# Patient Record
Sex: Male | Born: 1955 | Race: Black or African American | Hispanic: No | Marital: Married | Smoking: Former smoker
Health system: Southern US, Community
[De-identification: ages and names within clinical notes are randomized; demographics above are authoritative.]

## PROBLEM LIST (undated history)

## (undated) DIAGNOSIS — I10 Essential (primary) hypertension: Secondary | ICD-10-CM

## (undated) DIAGNOSIS — E119 Type 2 diabetes mellitus without complications: Secondary | ICD-10-CM

## (undated) DIAGNOSIS — E785 Hyperlipidemia, unspecified: Secondary | ICD-10-CM

## (undated) DIAGNOSIS — J449 Chronic obstructive pulmonary disease, unspecified: Secondary | ICD-10-CM

## (undated) DIAGNOSIS — I639 Cerebral infarction, unspecified: Secondary | ICD-10-CM

## (undated) DIAGNOSIS — C3490 Malignant neoplasm of unspecified part of unspecified bronchus or lung: Secondary | ICD-10-CM

## (undated) DIAGNOSIS — M1990 Unspecified osteoarthritis, unspecified site: Secondary | ICD-10-CM

## (undated) DIAGNOSIS — T7840XA Allergy, unspecified, initial encounter: Secondary | ICD-10-CM

## (undated) DIAGNOSIS — Z66 Do not resuscitate: Secondary | ICD-10-CM

## (undated) HISTORY — DX: Cerebral infarction, unspecified: I63.9

## (undated) HISTORY — PX: OTHER SURGICAL HISTORY: SHX169

## (undated) HISTORY — DX: Essential (primary) hypertension: I10

## (undated) HISTORY — DX: Hyperlipidemia, unspecified: E78.5

## (undated) HISTORY — DX: Chronic obstructive pulmonary disease, unspecified: J44.9

## (undated) HISTORY — DX: Do not resuscitate: Z66

## (undated) HISTORY — DX: Unspecified osteoarthritis, unspecified site: M19.90

## (undated) HISTORY — DX: Type 2 diabetes mellitus without complications: E11.9

## (undated) HISTORY — DX: Malignant neoplasm of unspecified part of unspecified bronchus or lung: C34.90

## (undated) HISTORY — DX: Allergy, unspecified, initial encounter: T78.40XA

---

## 2014-07-31 ENCOUNTER — Encounter (HOSPITAL_COMMUNITY): Payer: Medicare Other | Attending: Hematology & Oncology | Admitting: Hematology & Oncology

## 2014-07-31 ENCOUNTER — Encounter (HOSPITAL_COMMUNITY): Payer: Self-pay | Admitting: Hematology & Oncology

## 2014-07-31 VITALS — BP 105/69 | HR 113 | Temp 98.0°F | Resp 22 | Wt 163.4 lb

## 2014-07-31 DIAGNOSIS — J90 Pleural effusion, not elsewhere classified: Secondary | ICD-10-CM

## 2014-07-31 DIAGNOSIS — R918 Other nonspecific abnormal finding of lung field: Secondary | ICD-10-CM | POA: Diagnosis not present

## 2014-07-31 DIAGNOSIS — C3490 Malignant neoplasm of unspecified part of unspecified bronchus or lung: Secondary | ICD-10-CM | POA: Insufficient documentation

## 2014-07-31 DIAGNOSIS — M25511 Pain in right shoulder: Secondary | ICD-10-CM

## 2014-07-31 DIAGNOSIS — M549 Dorsalgia, unspecified: Secondary | ICD-10-CM

## 2014-07-31 DIAGNOSIS — D649 Anemia, unspecified: Secondary | ICD-10-CM

## 2014-07-31 DIAGNOSIS — Z87891 Personal history of nicotine dependence: Secondary | ICD-10-CM

## 2014-07-31 LAB — CBC WITH DIFFERENTIAL/PLATELET
BASOS ABS: 0 10*3/uL (ref 0.0–0.1)
BASOS PCT: 0 % (ref 0–1)
Eosinophils Absolute: 0.1 10*3/uL (ref 0.0–0.7)
Eosinophils Relative: 1 % (ref 0–5)
HCT: 26.9 % — ABNORMAL LOW (ref 39.0–52.0)
Hemoglobin: 7.7 g/dL — ABNORMAL LOW (ref 13.0–17.0)
LYMPHS PCT: 17 % (ref 12–46)
Lymphs Abs: 2.3 10*3/uL (ref 0.7–4.0)
MCH: 19.1 pg — ABNORMAL LOW (ref 26.0–34.0)
MCHC: 28.6 g/dL — ABNORMAL LOW (ref 30.0–36.0)
MCV: 66.7 fL — ABNORMAL LOW (ref 78.0–100.0)
Monocytes Absolute: 1.5 10*3/uL — ABNORMAL HIGH (ref 0.1–1.0)
Monocytes Relative: 12 % (ref 3–12)
Neutro Abs: 9.4 10*3/uL — ABNORMAL HIGH (ref 1.7–7.7)
Neutrophils Relative %: 70 % (ref 43–77)
PLATELETS: 829 10*3/uL — AB (ref 150–400)
RBC: 4.03 MIL/uL — ABNORMAL LOW (ref 4.22–5.81)
RDW: 21 % — ABNORMAL HIGH (ref 11.5–15.5)
WBC: 13.3 10*3/uL — AB (ref 4.0–10.5)

## 2014-07-31 LAB — COMPREHENSIVE METABOLIC PANEL
ALT: 11 U/L — ABNORMAL LOW (ref 17–63)
ANION GAP: 11 (ref 5–15)
AST: 23 U/L (ref 15–41)
Albumin: 2.3 g/dL — ABNORMAL LOW (ref 3.5–5.0)
Alkaline Phosphatase: 69 U/L (ref 38–126)
BUN: 10 mg/dL (ref 6–20)
CHLORIDE: 93 mmol/L — AB (ref 101–111)
CO2: 31 mmol/L (ref 22–32)
Calcium: 9.1 mg/dL (ref 8.9–10.3)
Creatinine, Ser: 0.58 mg/dL — ABNORMAL LOW (ref 0.61–1.24)
GFR calc Af Amer: >60 mL/min (ref >60)
GFR calc non Af Amer: >60 mL/min (ref >60)
Glucose, Bld: 97 mg/dL (ref 65–99)
Potassium: 3 mmol/L — ABNORMAL LOW (ref 3.5–5.1)
Sodium: 135 mmol/L (ref 135–145)
TOTAL PROTEIN: 8.5 g/dL — AB (ref 6.5–8.1)
Total Bilirubin: 0.5 mg/dL (ref 0.3–1.2)

## 2014-07-31 MED ORDER — OXYCODONE HCL 5 MG PO TABS
10.0000 mg | ORAL_TABLET | Freq: Once | ORAL | Status: AC
Start: 2014-07-31 — End: 2014-07-31
  Administered 2014-07-31: 10 mg via ORAL
  Filled 2014-07-31: qty 2

## 2014-07-31 MED ORDER — MORPHINE SULFATE ER 15 MG PO TBCR: 15.0000 mg | EXTENDED_RELEASE_TABLET | Freq: Two times a day (BID) | ORAL | Status: DC

## 2014-07-31 NOTE — Progress Notes (Signed)
Bradley Molina presented for labwork. Labs per MD order drawn via Peripheral Line 23 gauge needle inserted in right AC  Good blood return present. Procedure without incident.  Needle removed intact. Patient tolerated procedure well.

## 2014-07-31 NOTE — Patient Instructions (Addendum)
[place] at [facility] Discharge Instructions  RECOMMENDATIONS MADE BY THE CONSULTANT AND ANY TEST RESULTS WILL BE SENT TO YOUR REFERRING PHYSICIAN.  Exam and discussion by Dr. Whitney Muse. Will make a referral to Interventional Radiology for CT guided biopsy to get a diagnosis.  They will call you with instructions regarding the biopsy. Will check some labs today. Need to get a bone scan but will do after biopsy is performed. Referral to Radiation Oncology MS Contin 15 mg - take every 12 hours around the clock. Use the hydrocodone for break through pain. Follow-up here in 1 week.    Thank you for choosing [place] at [facility] to provide your oncology and hematology care.  To afford each patient quality time with our provider, please arrive at least 15 minutes before your scheduled appointment time.    You need to re-schedule your appointment should you arrive 10 or more minutes late.  We strive to give you quality time with our providers, and arriving late affects you and other patients whose appointments are after yours.  Also, if you no show three or more times for appointments you may be dismissed from the clinic at the providers discretion.     Again, thank you for choosing [facility]  Our hope is that these requests will decrease the amount of time that you wait before being seen by our physicians.       _____________________________________________________________  Should you have questions after your visit to [facility], please contact our office at [phone] between the hours of 8:30 a.m. and 4:30 p.m.  Voicemails left after 4:30 p.m. will not be returned until the following business day.  For prescription refill requests, have your pharmacy contact our office.

## 2014-07-31 NOTE — Progress Notes (Signed)
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 He presented with left sided weakness and slurred speech Anemia Pleurx catheter placement in right lung, malignant plural effusion   HISTORY OF PRESENTING ILLNESS:  Bradley Molina 59 y.o. male is here for a second opinion. He was admitted to [facility] for SOB on 06/19/2014. He was noted to have a large R pleural effusion. A thoracentesis was performed that showed no malignant cells. 1.6 L of fluid was removed. He was noted to have a large pleural-based mass as well as a right upper lobe mass. Hemoglobin was reported as 7.5 and he was given 2 units of packed red cells. He complained of shortness of breath weight loss and fatigue. Ultimately he had a Pleurx catheter placed for palliation and was discharged with hospice. It was felt to be too difficult to obtain a diagnostic biopsy given his underlying lung disease.  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 He was diagnosed with diabetes. He stopped coaching when he found out he had diabetes. He notes a significant change in his urinary frequency prior to his diagnosis of diabetes.  He doesn't sleep well at night because of pain. His pain is mostly in his right shoulder and left chest.He takes Hydrocodone 10 mg/325 mg every 4-6 hours as needed. He was disabled in 2010 due to back problems and notes he has been on hydrocodone since.  Since his lung cancer diagnosis he has sustained a CVA. He is not on any blood thinners. He was on baby aspirin and has not had a change in his platelet therapy since his CVA. He has had difficulty walking and R sided weakness since the stroke. He requires a walker.   He dresses himself "60/40", and needs help bathing himself. He states this is mostly from  R sided weakness and not fatigue.  MEDICAL HISTORY:  Past Medical History  Diagnosis Date  . Lung cancer   . Arthritis   . Hypertension   . Allergy   . COPD (chronic obstructive pulmonary disease)   . Diabetes mellitus without complication   . Stroke     SURGICAL HISTORY: History reviewed. No pertinent past surgical history.  SOCIAL HISTORY: History   Social History  . Marital Status: Married    Spouse Name: N/A  . Number of Children: N/A  . Years of Education: N/A   Occupational History  . Not on file.   Social History Main Topics  . Smoking status: Former Smoker -- 1.00 packs/day for 40 years    Types: Cigarettes    Quit date: 06/17/2014  . Smokeless tobacco: Not on file  . Alcohol Use: No  . Drug Use: No  . Sexual Activity: Not on file   Other Topics Concern  . Not on file   Social History Narrative  . No narrative on file  Married for 25 years with 3 children and 7 grandchildren He worked with furniture Former smoker; quit last month. Prior 40 to 50 pack year history Former drinker  FAMILY HISTORY: Family History  Problem Relation Age of Onset  . Cancer Mother   . COPD Mother   . Emphysema Father    indicated that his mother is deceased. He indicated that his father is deceased.  Mother was in early 51s when she died  of lung cancer. Father died of emphysema. His brother died.  3 living sisters, 2 other sisters died.  ALLERGIES:  is allergic to penicillins.  MEDICATIONS:  Current Outpatient Prescriptions  Medication Sig Dispense Refill  . amLODipine (NORVASC) 10 MG tablet     . ammonium lactate (AMLACTIN) 12 % cream Apply topically as needed for dry skin.    Marland Kitchen aspirin 81 MG tablet Take 81 mg by mouth daily.    . benazepril-hydrochlorthiazide (LOTENSIN HCT) 20-25 MG per tablet Take 1 tablet by mouth daily.    Marland Kitchen HYDROcodone-acetaminophen (NORCO) 10-325 MG per tablet Take 1 tablet by mouth every 6 (six) hours as needed for moderate pain.    .  metFORMIN (GLUCOPHAGE) 500 MG tablet Take 500 mg by mouth 2 (two) times daily with a meal.    . morphine (MS CONTIN) 15 MG 12 hr tablet Take 1 tablet (15 mg total) by mouth every 12 (twelve) hours. 60 tablet 0   No current facility-administered medications for this visit.    Review of Systems  Constitutional: Positive for malaise/fatigue. Negative for fever, chills and weight loss.  HENT: Negative for congestion, ear discharge, ear pain, hearing loss, nosebleeds, sore throat and tinnitus.   Eyes: Negative for blurred vision, double vision, photophobia, pain, discharge and redness.  Respiratory: Negative for cough and stridor.   Cardiovascular: Negative for chest pain, palpitations, orthopnea, claudication, leg swelling and PND.  Gastrointestinal: Negative for nausea, vomiting and abdominal pain.  Genitourinary: Negative for dysuria and hematuria.  Musculoskeletal: Positive for joint pain and neck pain. Negative for myalgias.  Skin: Negative for rash.  Neurological: Positive for focal weakness and weakness. Negative for dizziness, tingling, tremors, sensory change, speech change, seizures, loss of consciousness and headaches.       Uses walker  Endo/Heme/Allergies: Negative.   Psychiatric/Behavioral: Negative for depression, suicidal ideas, hallucinations, memory loss and substance abuse. The patient has insomnia.    14 point ROS was done and is otherwise as detailed above or in HPI   PHYSICAL EXAMINATION: ECOG PERFORMANCE STATUS: 2 - Symptomatic, <50% confined to bed  Filed Vitals:   07/31/14 1420  BP: 105/69  Pulse: 113  Temp: 98 F (36.7 C)  Resp: 22   Filed Weights   07/31/14 1420  Weight: 163 lb 6.4 oz (74.118 kg)     Physical Exam  Constitutional: He is oriented to person, place, and time. No distress.  thin  HENT:  Head: Normocephalic and atraumatic.  Mouth/Throat: Oropharynx is clear and moist. No oropharyngeal exudate.  Eyes: Conjunctivae are normal. Pupils are  equal, round, and reactive to light. Right eye exhibits no discharge. Left eye exhibits no discharge. No scleral icterus.  Neck: Normal range of motion. Neck supple.  Cardiovascular: Normal rate and regular rhythm.   Pulmonary/Chest:  Decreased BS R lung, occasional rhonchi L lung. pleurx catheter C/D/I  Abdominal: Soft. Bowel sounds are normal. He exhibits no distension and no mass. There is no tenderness. There is no rebound and no guarding.  Musculoskeletal: Normal range of motion.  Lymphadenopathy:    He has no cervical adenopathy.  Neurological: He is alert and oriented to person, place, and time.  Uses walker, decreased strength R side but mostly in RLE  Skin: Skin is warm and dry. He is not diaphoretic. No erythema.  Psychiatric: Mood, memory, affect and judgment normal.    LABORATORY DATA:  I have reviewed the data as listed No results found for: WBC, HGB, HCT, MCV, PLT  06/19/2014  Right pleural fluid shows inflammatory infiltrate consisting predominantly of histiocytes and lymphocytes with occasional acute inflammatory cells  07/02/2014 ECHO with 60-65% EF    RADIOGRAPHIC STUDIES: Reports only, from [facility] CT Chest w/wo contrast  High suspicion for RML/RUL mass. Mass is large measuring 7.7 cm. high concern for metastatic mediastinal nodal metastases. Large right pleural effusion occupying the entirety of the right hemithorax atelectasis of the right lower lobe  ASSESSMENT & PLAN:  Abnormal CT imaging of the chest Large Pleural Effusion, pleurx catheter placement Performance status of 2 Pain, chronic back pain, RU shoulder pain (acute) Anemia  59 year old male with prior tobacco history who presents with a lung mass, pleural-based lung mass and pleural effusion. He presented with shortness of breath and weakness. CT findings on workup were grossly abnormal as detailed above, thoracentesis was performed but no malignant cells were noted. Biopsy was not pursued  secondary to underlying lung disease. He was discharged with hospice.  He and his wife present today for a second opinion. He has a performance status of 2 but I feel it is not all related to his malignancy. He is unfortunately sustained a CVA since his cancer diagnosis. He still looks surprisingly well. He now needs a walker for ambulation.  His pain is inadequately controlled. He has been on hydrocodone per his report for many years because of back pain. I have recommended starting him on extended release morphine and using his hydrocodone for breakthrough pain. We will increase his extended release morphine accordingly over the next week.  We discussed reasonable goals of care. He understands that his diagnosis is not curable. He would however like to pursue therapy if it is still an option. As stated we do not have a formal tissue diagnosis.  I have discussed his case with interventional radiology, who kindly agreed to biopsy the patient. I will then refer him to radiation oncology for palliation of the right upper chest. Pending his final biopsy results we will discuss potential systemic treatment options. In regards to additional antiplatelet therapy or anticoagulation given his CVA in the setting of his cancer, we will readdress this after his biopsy.  He will need a CBC and CMP today. Pending the results of his blood counts he may need an additional anemia evaluation. He was transfused at his original presentation at [facility] because of significant anemia.   Orders Placed This Encounter  Procedures  . CT Biopsy    Standing Status: Future     Number of Occurrences:      Standing Expiration Date: 07/31/2015    Order Specific Question:  Lab orders requested (DO NOT place separate lab orders, these will be automatically ordered during procedure specimen collection):    Answer:  Surgical Pathology    Order Specific Question:  Reason for Exam (SYMPTOM  OR DIAGNOSIS REQUIRED)     Answer:  RUL mass, DISCUSS WITH DR. Laurence Ferrari    Order Specific Question:  Preferred imaging location?    Answer:  [facility]    All questions were answered. The patient knows to call the clinic with any problems, questions or concerns.  This note was electronically signed.   This document serves as a record of services personally performed by Ancil Linsey, MD. It was created on her behalf by Pearlie Oyster, a trained medical scribe. The creation of this record is based on the scribe's personal observations and the provider's statements to them. This document has been checked and approved by the attending  provider.    I have reviewed the above documentation for accuracy and completeness, and I agree with the above.  Kelby Fam. Aleigha Gilani MD

## 2014-08-01 ENCOUNTER — Other Ambulatory Visit: Payer: Self-pay | Admitting: Radiology

## 2014-08-01 ENCOUNTER — Encounter (HOSPITAL_COMMUNITY): Payer: Self-pay | Admitting: Lab

## 2014-08-01 ENCOUNTER — Encounter: Payer: Self-pay | Admitting: Dietician

## 2014-08-01 NOTE — Progress Notes (Signed)
Referral sent to [facility].  Records faxed on 5/20.  I talked to Bradley Molina their and they will try to get him in the week of 5/23

## 2014-08-01 NOTE — Progress Notes (Signed)
New Onc Pt. MD asked to contact patient regarding Case of Glucerna supplement.  Contacted Pt by phone  Wt Readings from Last 10 Encounters:  07/31/14 163 lb 6.4 oz (74.118 kg)  Pt was asleep upon calling. Spoke with wife.  Introduced myself and Let her knew what part I would play in the pt's upcoming treatment.   Wife reports oral intake as "not what is used to be" . She says he has recently had a poor intake. She said he has not complained of any symptoms thus far. She acknowledged that it has only been a short while since pt's diagnosis and it sounds like that may have impacted his appetite.   She guessed that the patient would like Vanilla Glucerna. Let her know it would be available next Friday.  Burtis Junes RD, LDN Nutrition Pager: [phone] 08/01/2014 3:09 PM

## 2014-08-04 ENCOUNTER — Telehealth (HOSPITAL_COMMUNITY): Payer: Self-pay | Admitting: *Deleted

## 2014-08-04 ENCOUNTER — Ambulatory Visit (HOSPITAL_COMMUNITY)
Admission: RE | Admit: 2014-08-04 | Discharge: 2014-08-04 | Disposition: A | Discharge status: Home / Self Care | Payer: Medicare Other | Source: Ambulatory Visit | Attending: Hematology & Oncology | Admitting: Hematology & Oncology

## 2014-08-04 ENCOUNTER — Other Ambulatory Visit (HOSPITAL_COMMUNITY): Payer: Self-pay | Admitting: *Deleted

## 2014-08-04 ENCOUNTER — Other Ambulatory Visit (HOSPITAL_COMMUNITY): Payer: Medicare Other

## 2014-08-04 ENCOUNTER — Encounter (HOSPITAL_COMMUNITY): Payer: Self-pay

## 2014-08-04 DIAGNOSIS — Z87891 Personal history of nicotine dependence: Secondary | ICD-10-CM | POA: Diagnosis not present

## 2014-08-04 DIAGNOSIS — M1990 Unspecified osteoarthritis, unspecified site: Secondary | ICD-10-CM | POA: Diagnosis not present

## 2014-08-04 DIAGNOSIS — J449 Chronic obstructive pulmonary disease, unspecified: Secondary | ICD-10-CM | POA: Insufficient documentation

## 2014-08-04 DIAGNOSIS — Z8673 Personal history of transient ischemic attack (TIA), and cerebral infarction without residual deficits: Secondary | ICD-10-CM | POA: Insufficient documentation

## 2014-08-04 DIAGNOSIS — R079 Chest pain, unspecified: Secondary | ICD-10-CM | POA: Diagnosis not present

## 2014-08-04 DIAGNOSIS — Z825 Family history of asthma and other chronic lower respiratory diseases: Secondary | ICD-10-CM | POA: Diagnosis not present

## 2014-08-04 DIAGNOSIS — E876 Hypokalemia: Secondary | ICD-10-CM

## 2014-08-04 DIAGNOSIS — C3410 Malignant neoplasm of upper lobe, unspecified bronchus or lung: Secondary | ICD-10-CM | POA: Insufficient documentation

## 2014-08-04 DIAGNOSIS — D649 Anemia, unspecified: Secondary | ICD-10-CM

## 2014-08-04 DIAGNOSIS — Z7982 Long term (current) use of aspirin: Secondary | ICD-10-CM | POA: Insufficient documentation

## 2014-08-04 DIAGNOSIS — I10 Essential (primary) hypertension: Secondary | ICD-10-CM | POA: Insufficient documentation

## 2014-08-04 DIAGNOSIS — M549 Dorsalgia, unspecified: Secondary | ICD-10-CM | POA: Diagnosis not present

## 2014-08-04 DIAGNOSIS — Z79899 Other long term (current) drug therapy: Secondary | ICD-10-CM | POA: Insufficient documentation

## 2014-08-04 DIAGNOSIS — E119 Type 2 diabetes mellitus without complications: Secondary | ICD-10-CM | POA: Insufficient documentation

## 2014-08-04 DIAGNOSIS — Z9889 Other specified postprocedural states: Secondary | ICD-10-CM

## 2014-08-04 DIAGNOSIS — R918 Other nonspecific abnormal finding of lung field: Secondary | ICD-10-CM

## 2014-08-04 LAB — CBC
HCT: 27.2 % — ABNORMAL LOW (ref 39.0–52.0)
Hemoglobin: 7.6 g/dL — ABNORMAL LOW (ref 13.0–17.0)
MCH: 18.4 pg — ABNORMAL LOW (ref 26.0–34.0)
MCHC: 27.9 g/dL — ABNORMAL LOW (ref 30.0–36.0)
MCV: 65.9 fL — ABNORMAL LOW (ref 78.0–100.0)
Platelets: 764 10*3/uL — ABNORMAL HIGH (ref 150–400)
RBC: 4.13 MIL/uL — AB (ref 4.22–5.81)
RDW: 20.9 % — ABNORMAL HIGH (ref 11.5–15.5)
WBC: 11.8 10*3/uL — ABNORMAL HIGH (ref 4.0–10.5)

## 2014-08-04 LAB — PROTIME-INR
INR: 1.18 (ref 0.00–1.49)
Prothrombin Time: 15.2 seconds (ref 11.6–15.2)

## 2014-08-04 LAB — APTT: aPTT: 36 seconds (ref 24–37)

## 2014-08-04 MED ORDER — HYDROCODONE-ACETAMINOPHEN 5-325 MG PO TABS
1.0000 | ORAL_TABLET | ORAL | Status: DC | PRN
  Filled 2014-08-04: qty 2

## 2014-08-04 MED ORDER — SODIUM CHLORIDE 0.9 % IV SOLN: INTRAVENOUS | Status: DC

## 2014-08-04 MED ORDER — LIDOCAINE HCL 1 % IJ SOLN
INTRAMUSCULAR | Status: AC
  Filled 2014-08-04: qty 20

## 2014-08-04 MED ORDER — FENTANYL CITRATE (PF) 100 MCG/2ML IJ SOLN
INTRAMUSCULAR | Status: AC | PRN
  Administered 2014-08-04: 50 ug via INTRAVENOUS

## 2014-08-04 MED ORDER — MIDAZOLAM HCL 2 MG/2ML IJ SOLN
INTRAMUSCULAR | Status: AC
  Filled 2014-08-04: qty 2

## 2014-08-04 MED ORDER — POTASSIUM CHLORIDE CRYS ER 20 MEQ PO TBCR: 40.0000 meq | EXTENDED_RELEASE_TABLET | Freq: Two times a day (BID) | ORAL | Status: AC

## 2014-08-04 MED ORDER — MIDAZOLAM HCL 2 MG/2ML IJ SOLN
INTRAMUSCULAR | Status: AC | PRN
  Administered 2014-08-04 (×2): 1 mg via INTRAVENOUS

## 2014-08-04 MED ORDER — FENTANYL CITRATE (PF) 100 MCG/2ML IJ SOLN
INTRAMUSCULAR | Status: AC
  Filled 2014-08-04: qty 2

## 2014-08-04 NOTE — Procedures (Signed)
Interventional Radiology Procedure Note  Procedure: CT guided biopsy of right apical mass Complications: No immediate Recommendations: - Bedrest until CXR cleared.  Minimize talking, coughing or otherwise straining.  - Follow up 2 hr CXR pending   Signed,  Criselda Peaches, MD

## 2014-08-04 NOTE — Telephone Encounter (Signed)
Potassium called in to [facility] and Ivin Booty notified

## 2014-08-04 NOTE — Sedation Documentation (Signed)
Patient is resting comfortably. 

## 2014-08-04 NOTE — Discharge Instructions (Signed)
Needle Biopsy of Lung, Care After °Refer to this sheet in the next few weeks. These instructions provide you with information on caring for yourself after your procedure. Your health care provider may also give you more specific instructions. Your treatment has been planned according to current medical practices, but problems sometimes occur. Call your health care provider if you have any problems or questions after your procedure. °WHAT TO EXPECT AFTER THE PROCEDURE °· A bandage will be applied over the area where the needle was inserted. You may be asked to apply pressure to the bandage for several minutes to ensure there is minimal bleeding. °· In most cases, you can leave when your needle biopsy procedure is completed. Do not drive yourself home. Someone else should take you home. °· If you received an IV sedative or general anesthetic, you will be taken to a comfortable place to relax while the medicine wears off. °· If you have upcoming travel scheduled, talk to your health care provider about when it is safe to travel by air after the procedure. °HOME CARE INSTRUCTIONS °· Expect to take it easy for the rest of the day. °· Protect the area where you received the needle biopsy by keeping the bandage in place for as Yianna Tersigni as instructed. °· You may feel some mild pain or discomfort in the area, but this should stop in a day or two. °· Take medicines only as directed by your health care provider. °SEEK MEDICAL CARE IF:  °· You have pain at the biopsy site that worsens or is not helped by medicine. °· You have swelling or drainage at the needle biopsy site. °· You have a fever. °SEEK IMMEDIATE MEDICAL CARE IF:  °· You have new or worsening shortness of breath. °· You have chest pain. °· You are coughing up blood. °· You have bleeding that does not stop with pressure or a bandage. °· You develop light-headedness or fainting. °Document Released: 12/26/2006 Document Revised: 07/15/2013 Document Reviewed:  07/23/2012 °ExitCare® Patient Information ©2015 [employer]. This information is not intended to replace advice given to you by your health care provider. Make sure you discuss any questions you have with your health care provider. ° °

## 2014-08-04 NOTE — H&P (Signed)
Chief Complaint: Pancoast tumor Painful chest/back  Referring Physician(s): Penland,Shannon K  History of Present Illness: Bradley Molina is a 59 y.o. male  Pt with painful Rt chest and back Known pancoast tumor Has had several thoracentesis without diagnosis Dr Whitney Muse has discussed case with Dr Laurence Ferrari Requests RUL lung mass bx for diagnosis Dr Laurence Ferrari approves procedure   Past Medical History  Diagnosis Date  . Lung cancer   . Arthritis   . Hypertension   . Allergy   . COPD (chronic obstructive pulmonary disease)   . Diabetes mellitus without complication   . Stroke     History reviewed. No pertinent past surgical history.  Allergies: Penicillins  Medications: Prior to Admission medications   Medication Sig Start Date End Date Taking? Authorizing Provider  amLODipine (NORVASC) 10 MG tablet  07/19/14  Yes Historical Provider, MD  ammonium lactate (AMLACTIN) 12 % cream Apply topically as needed for dry skin.   Yes [name], MD  aspirin 81 MG tablet Take 81 mg by mouth daily.   Yes [name], MD  benazepril-hydrochlorthiazide (LOTENSIN HCT) 20-25 MG per tablet Take 1 tablet by mouth daily.   Yes [name], MD  HYDROcodone-acetaminophen (NORCO) 10-325 MG per tablet Take 1 tablet by mouth every 6 (six) hours as needed for moderate pain.   Yes [name], MD  metFORMIN (GLUCOPHAGE) 500 MG tablet Take 500 mg by mouth 2 (two) times daily with a meal.   Yes [name], MD  morphine (MS CONTIN) 15 MG 12 hr tablet Take 1 tablet (15 mg total) by mouth every 12 (twelve) hours. 07/31/14  Yes Patrici Ranks, MD     Family History  Problem Relation Age of Onset  . Cancer Mother   . COPD Mother   . Emphysema Father     History   Social History  . Marital Status: Married    Spouse Name: N/A  . Number of Children: N/A  . Years of Education: N/A   Social History Main Topics  . Smoking status: Former Smoker -- 1.00 packs/day for 40  years    Types: Cigarettes    Quit date: 06/17/2014  . Smokeless tobacco: Not on file  . Alcohol Use: No  . Drug Use: No  . Sexual Activity: Not on file   Other Topics Concern  . None   Social History Narrative    Review of Systems: A 12 point ROS discussed and pertinent positives are indicated in the HPI above.  All other systems are negative.  Review of Systems  Constitutional: Positive for activity change, appetite change and fatigue.  Respiratory: Positive for cough and shortness of breath.   Cardiovascular: Positive for chest pain.  Gastrointestinal: Negative for abdominal pain.  Musculoskeletal: Positive for back pain.  Neurological: Positive for weakness.  Psychiatric/Behavioral: Negative for behavioral problems and confusion.    Vital Signs: BP 105/61 mmHg  Pulse 102  Temp(Src) 98.4 F (36.9 C) (Oral)  Resp 18  Ht '6\' 2"'$  (1.88 m)  Wt 73.936 kg (163 lb)  BMI 20.92 kg/m2  SpO2 94%  Physical Exam  Constitutional: He is oriented to person, place, and time. He appears well-developed.  Cardiovascular: Normal rate, regular rhythm and normal heart sounds.   No murmur heard. Pulmonary/Chest: Effort normal and breath sounds normal. He has no wheezes.  Abdominal: Soft. Bowel sounds are normal. There is no tenderness.  Musculoskeletal: Normal range of motion.  Neurological: He is alert and  oriented to person, place, and time.  Skin: Skin is warm and dry.  Psychiatric: He has a normal mood and affect. His behavior is normal. Judgment and thought content normal.  Nursing note and vitals reviewed.   Mallampati Score:  MD Evaluation Airway: WNL Heart: WNL Abdomen: WNL Chest/ Lungs: WNL ASA  Classification: 3 Mallampati/Airway Score: One  Imaging: No results found.  Labs:  CBC:  Recent Labs  07/31/14 1619  WBC 13.3*  HGB 7.7*  HCT 26.9*  PLT 829*    COAGS: No results for input(s): INR, APTT in the last 8760 hours.  BMP:  Recent Labs   07/31/14 1619  NA 135  K 3.0*  CL 93*  CO2 31  GLUCOSE 97  BUN 10  CALCIUM 9.1  CREATININE 0.58*  GFRNONAA >60  GFRAA >60    LIVER FUNCTION TESTS:  Recent Labs  07/31/14 1619  BILITOT 0.5  AST 23  ALT 11*  ALKPHOS 69  PROT 8.5*  ALBUMIN 2.3*    TUMOR MARKERS: No results for input(s): AFPTM, CEA, CA199, CHROMGRNA in the last 8760 hours.  Assessment and Plan:  Pancoast tumor Painful chest and back Many thoracentesis without diagnosis. Scheduled now for RUL lung mass bx Risks and Benefits discussed with the patient including, but not limited to bleeding, hemoptysis, respiratory failure requiring intubation, infection, pneumothorax requiring chest tube placement, stroke from air embolism or even death. All of the patient's questions were answered, patient is agreeable to proceed. Consent signed and in chart.   Thank you for this interesting consult.  I greatly enjoyed meeting Mauricio Foulks and look forward to participating in their care.  Signed: Harish Bram A 08/04/2014, 10:56 AM   I spent a total of  20 Minutes   in face to face in clinical consultation, greater than 50% of which was counseling/coordinating care for lung mass bx

## 2014-08-04 NOTE — Sedation Documentation (Signed)
Pt extremely anxious prior to procedure. States he has difficulty laying prone. MD aware

## 2014-08-04 NOTE — Patient Instructions (Signed)
We will check some additional blood work and will transfuse 2 units of blood tomorrow.  We have called in potassium pills for you and we need for you to take 2 pills twice a day (40 meq total 2 times a day)

## 2014-08-04 NOTE — Sedation Documentation (Signed)
Patient is resting comfortably.Tolerating procedure well.

## 2014-08-05 ENCOUNTER — Encounter (HOSPITAL_COMMUNITY): Payer: Medicare Other

## 2014-08-05 ENCOUNTER — Encounter (HOSPITAL_BASED_OUTPATIENT_CLINIC_OR_DEPARTMENT_OTHER): Payer: Medicare Other

## 2014-08-05 DIAGNOSIS — D649 Anemia, unspecified: Secondary | ICD-10-CM

## 2014-08-05 DIAGNOSIS — R918 Other nonspecific abnormal finding of lung field: Secondary | ICD-10-CM | POA: Diagnosis not present

## 2014-08-05 LAB — FERRITIN: FERRITIN: 62 ng/mL (ref 24–336)

## 2014-08-05 LAB — IRON AND TIBC
Iron: 14 ug/dL — ABNORMAL LOW (ref 45–182)
Saturation Ratios: 4 % — ABNORMAL LOW (ref 17.9–39.5)
TIBC: 318 ug/dL (ref 250–450)
UIBC: 304 ug/dL

## 2014-08-05 LAB — ABO/RH: ABO/RH(D): B POS

## 2014-08-05 LAB — VITAMIN B12: VITAMIN B 12: 664 pg/mL (ref 180–914)

## 2014-08-05 LAB — FOLATE: FOLATE: 7.7 ng/mL (ref >5.9)

## 2014-08-05 LAB — PREPARE RBC (CROSSMATCH)

## 2014-08-05 NOTE — Progress Notes (Signed)
LABS DRAWN

## 2014-08-07 ENCOUNTER — Encounter (HOSPITAL_BASED_OUTPATIENT_CLINIC_OR_DEPARTMENT_OTHER): Payer: Medicare Other

## 2014-08-07 ENCOUNTER — Encounter (HOSPITAL_COMMUNITY): Payer: Self-pay

## 2014-08-07 VITALS — BP 98/59 | HR 95 | Temp 98.2°F | Resp 20

## 2014-08-07 DIAGNOSIS — D649 Anemia, unspecified: Secondary | ICD-10-CM

## 2014-08-07 DIAGNOSIS — R918 Other nonspecific abnormal finding of lung field: Secondary | ICD-10-CM | POA: Diagnosis not present

## 2014-08-07 MED ORDER — SODIUM CHLORIDE 0.9 % IV SOLN
250.0000 mL | Freq: Once | INTRAVENOUS | Status: AC
  Administered 2014-08-07: 250 mL via INTRAVENOUS

## 2014-08-07 MED ORDER — SODIUM CHLORIDE 0.9 % IJ SOLN: 10.0000 mL | INTRAMUSCULAR | Status: DC | PRN

## 2014-08-07 MED ORDER — DIPHENHYDRAMINE HCL 25 MG PO CAPS
25.0000 mg | ORAL_CAPSULE | Freq: Once | ORAL | Status: AC
  Administered 2014-08-07: 25 mg via ORAL
  Filled 2014-08-07: qty 1

## 2014-08-07 MED ORDER — ACETAMINOPHEN 325 MG PO TABS
650.0000 mg | ORAL_TABLET | Freq: Once | ORAL | Status: AC
  Administered 2014-08-07: 650 mg via ORAL
  Filled 2014-08-07: qty 2

## 2014-08-07 NOTE — Patient Instructions (Signed)
[place] at [facility] Discharge Instructions  RECOMMENDATIONS MADE BY THE CONSULTANT AND ANY TEST RESULTS WILL BE SENT TO YOUR REFERRING PHYSICIAN.  Given 2 units of blood today. You can take 2 tablets 30 mg of morphine every 12 hrs for the next few days Then take 3 tablets 45 mg of morphine every 12 hours for the next few days Let us know how this is working for you.  You will run out of medication early and we will give you a refill.  Thank you for choosing [place] at [facility] to provide your oncology and hematology care.  To afford each patient quality time with our provider, please arrive at least 15 minutes before your scheduled appointment time.    You need to re-schedule your appointment should you arrive 10 or more minutes late.  We strive to give you quality time with our providers, and arriving late affects you and other patients whose appointments are after yours.  Also, if you no show three or more times for appointments you may be dismissed from the clinic at the providers discretion.     Again, thank you for choosing [facility].  Our hope is that these requests will decrease the amount of time that you wait before being seen by our physicians.       _____________________________________________________________  Should you have questions after your visit to [facility], please contact our office at [phone] between the hours of 8:30 a.m. and 4:30 p.m.  Voicemails left after 4:30 p.m. will not be returned until the following business day.  For prescription refill requests, have your pharmacy contact our office.

## 2014-08-07 NOTE — Progress Notes (Signed)
Bradley Molina Tolerated 2 units of blood Discharged ambulatory with walker

## 2014-08-08 ENCOUNTER — Encounter: Payer: Self-pay | Admitting: Dietician

## 2014-08-08 ENCOUNTER — Encounter (HOSPITAL_BASED_OUTPATIENT_CLINIC_OR_DEPARTMENT_OTHER): Payer: Medicare Other | Admitting: Hematology & Oncology

## 2014-08-08 VITALS — BP 114/74 | HR 96 | Temp 99.1°F | Resp 18 | Wt 163.5 lb

## 2014-08-08 DIAGNOSIS — E46 Unspecified protein-calorie malnutrition: Secondary | ICD-10-CM | POA: Diagnosis not present

## 2014-08-08 DIAGNOSIS — C3411 Malignant neoplasm of upper lobe, right bronchus or lung: Secondary | ICD-10-CM | POA: Diagnosis not present

## 2014-08-08 DIAGNOSIS — J90 Pleural effusion, not elsewhere classified: Secondary | ICD-10-CM

## 2014-08-08 DIAGNOSIS — G893 Neoplasm related pain (acute) (chronic): Secondary | ICD-10-CM

## 2014-08-08 LAB — TYPE AND SCREEN
ABO/RH(D): B POS
ANTIBODY SCREEN: NEGATIVE
Unit division: 0
Unit division: 0

## 2014-08-08 MED ORDER — MORPHINE SULFATE ER 60 MG PO TBCR: 60.0000 mg | EXTENDED_RELEASE_TABLET | Freq: Two times a day (BID) | ORAL | Status: DC

## 2014-08-08 NOTE — Patient Instructions (Signed)
[place] at [facility] Discharge Instructions  RECOMMENDATIONS MADE BY THE CONSULTANT AND ANY TEST RESULTS WILL BE SENT TO YOUR REFERRING PHYSICIAN.  You were given a Seleen Walter prescription for pain medication (MS Contin 60 mg tablet one every 12 hours).  We will get you scheduled for a CT scan of your abdomen. Keep appointment for Bone Scan. Keep appointments for Radiation. MD appointment in 1 week. We will give you a iron infusion next week when you are here to see MD. Report any issues/concerns as needed prior to appointments.  Thank you for choosing [place] at [facility] to provide your oncology and hematology care.  To afford each patient quality time with our provider, please arrive at least 15 minutes before your scheduled appointment time.    You need to re-schedule your appointment should you arrive 10 or more minutes late.  We strive to give you quality time with our providers, and arriving late affects you and other patients whose appointments are after yours.  Also, if you no show three or more times for appointments you may be dismissed from the clinic at the providers discretion.     Again, thank you for choosing [facility].  Our hope is that these requests will decrease the amount of time that you wait before being seen by our physicians.       _____________________________________________________________  Should you have questions after your visit to [facility], please contact our office at [phone] between the hours of 8:30 a.m. and 4:30 p.m.  Voicemails left after 4:30 p.m. will not be returned until the following business day.  For prescription refill requests, have your pharmacy contact our office.

## 2014-08-08 NOTE — Progress Notes (Signed)
Assessed pt when I dropped off his case of Glucerna  Wt Readings from Last 10 Encounters:  08/08/14 163 lb 8 oz (74.163 kg)  07/31/14 163 lb 6.4 oz (74.118 kg)   There is little documentation on Pt's weight. Per pt, his weight has "fallen", but was not able to quantify this.   Patient reports oral intake as fair and is suffering from a poor appetite.   He eats 2-3 meals daily. We discussed the importance of eating high protein calorie foods. Pt had already received list of these foods from dietitian at [employer] office in [place].   Discussed with pt that when he starts radiation he may exhibit trouble swallowing. During that time it is important he continues to eat high protein items by sticking to soft and moist sources.   Pt did not seem to be too interested in talking with me. Family Member was much more interactive.   Let them knew to contact me if he would like another case of glucerna.  Left my contact info, coupons, samples and handouts titled "Soft and Moist High protein foods" and "increasing calories and protein".  Burtis Junes RD, LDN Nutrition Pager: [phone] 08/08/2014 12:07 PM

## 2014-08-08 NOTE — Progress Notes (Addendum)
DIAGNOSIS:  CHIEF COMPLAINTS/PURPOSE OF CONSULTATION:  Second opinion on: Lung cancer, no biopsy Recent CVA 06/30/14 Admitted at [facility]. He presented with left sided weakness and slurred speech Anemia Pleurx catheter placement in right lung, malignant pleural effusion   CURRENT THERAPY: none  INTERVAL HISTORY: Bradley Molina 59 y.o. male returns for follow-up of a R sided lung mass and pleural effusion. He has undergone a biopsy and is here to review the results. He has significant right shoulder pain and we have started him on long-acting morphine but he notes his pain is only minimally improved. He has seen Dr. Pablo Ledger for consideration of palliative radiation. He states he has minimal drainage from his Pleurx catheter. He has received a transfusion for his anemia and states he feels his anemia and energy are improved. He is unable to sleep secondary to his pain.  He gets up everyday and gets himself dressed. He sits a lot but manages to do daily tasks. He still gets around and goes to church.  Currently he takes an oxycodone every 6 hours and still feels the shoulder pain. He feels as though his shoulder pain is so bad that he no longer feels his previous back pain.  MEDICAL HISTORY: Past Medical History  Diagnosis Date   Lung cancer    Arthritis    Hypertension    Allergy    COPD (chronic obstructive pulmonary disease)    Diabetes mellitus without complication    Stroke     has Lung cancer and Lung mass on his problem list.     is allergic to penicillins.   SURGICAL HISTORY: No past surgical history on file.  SOCIAL HISTORY: History   Social History   Marital Status: Married    Spouse Name: N/A   Number of Children: N/A   Years of Education: N/A   Occupational History   Not on file.   Social History Main Topics   Smoking status: Former Smoker -- 1.00 packs/day for 40 years    Types: Cigarettes    Quit date:  06/17/2014   Smokeless tobacco: Not on file   Alcohol Use: No   Drug Use: No   Sexual Activity: Not on file   Other Topics Concern   Not on file   Social History Narrative    FAMILY HISTORY: Family History  Problem Relation Age of Onset   Cancer Mother    COPD Mother    Emphysema Father     Review of Systems  Constitutional: Positive for weight loss and malaise/fatigue. Negative for fever and chills.       Not sleeping well due to pain.  Eyes: Negative.   Respiratory: Positive for shortness of breath.   Cardiovascular: Negative.   Gastrointestinal: Positive for constipation.  Musculoskeletal: Positive for back pain and neck pain.       Severe shoulder pain. The back pain is muted by intensity of the shoulder pain.  Skin: Negative.   Neurological: Positive for sensory change and focal weakness. Negative for dizziness, tingling, tremors, seizures and loss of consciousness.  Endo/Heme/Allergies: Negative.   Psychiatric/Behavioral: The patient has insomnia.     PHYSICAL EXAMINATION  ECOG PERFORMANCE STATUS: 2 - Symptomatic, <50% confined to bed  There were no vitals filed for this visit.  Physical Exam  Constitutional: He is oriented to person, place, and time.  Thin pleasant, NAD  HENT:  Head: Normocephalic and atraumatic.  Mouth/Throat: Oropharynx is clear and moist.  Eyes: Conjunctivae  and EOM are normal. Pupils are equal, round, and reactive to light.  Neck: Normal range of motion. Neck supple.  Cardiovascular: Normal rate, regular rhythm and normal heart sounds.   Pulmonary/Chest: Effort normal.  Decreased BS R> L  Abdominal: Soft. Bowel sounds are normal. He exhibits no distension and no mass. There is no tenderness. There is no rebound and no guarding.  Musculoskeletal: Normal range of motion. He exhibits no edema.  Neurological: He is alert and oriented to person, place, and time. No cranial nerve deficit.  Uses Walker to ambulate, gets to the exam  table without assistance  Skin: Skin is warm and dry.  Nursing note and vitals reviewed.   LABORATORY DATA:  CBC    Component Value Date/Time   WBC 11.8* 08/04/2014 1026   RBC 4.13* 08/04/2014 1026   HGB 7.6* 08/04/2014 1026   HCT 27.2* 08/04/2014 1026   PLT 764* 08/04/2014 1026   MCV 65.9* 08/04/2014 1026   MCH 18.4* 08/04/2014 1026   MCHC 27.9* 08/04/2014 1026   RDW 20.9* 08/04/2014 1026   LYMPHSABS 2.3 07/31/2014 1619   MONOABS 1.5* 07/31/2014 1619   EOSABS 0.1 07/31/2014 1619   BASOSABS 0.0 07/31/2014 1619   CMP     Component Value Date/Time   NA 135 07/31/2014 1619   K 3.0* 07/31/2014 1619   CL 93* 07/31/2014 1619   CO2 31 07/31/2014 1619   GLUCOSE 97 07/31/2014 1619   BUN 10 07/31/2014 1619   CREATININE 0.58* 07/31/2014 1619   CALCIUM 9.1 07/31/2014 1619   PROT 8.5* 07/31/2014 1619   ALBUMIN 2.3* 07/31/2014 1619   AST 23 07/31/2014 1619   ALT 11* 07/31/2014 1619   ALKPHOS 69 07/31/2014 1619   BILITOT 0.5 07/31/2014 1619   GFRNONAA >60 07/31/2014 1619   GFRAA >60 07/31/2014 1619     PATHOLOGY: FINAL DIAGNOSIS Diagnosis Lung, needle/core biopsy(ies), right upper lobe apical mass - POSITIVE FOR POORLY DIFFERENTIATED CARCINOMA. - SEE COMMENT. Microscopic Comment Sections demonstrate a poorly differentiated tumor. Immunohistochemical stains are performed. The tumor is positive for cytokeratin AE1/AE3 and CD10. There is focal cytokeratin 7 positivity. The tumor is negative for Napsin-A, TTF-1, CDX-2, cytokeratin 5/6, cytokeratin 20, PSA, and S100. The morphology coupled with the staining pattern is consistent with a poorly differentiated carcinoma. Although the CD10 positivity raises the possibility of a tumor of primary renal origin, close clinical and radiologic correlation is ultimately necessary. The block can be sent for molecular studies upon request. The findings are called to Dr. Whitney Muse on 08/07/14. Dr. Donato Heinz has seen this case in consultation with  agreement. (RAH:gt, 08/07/14)    ASSESSMENT and THERAPY PLAN:  Abnormal CT imaging of the chest with right sided lung mass and pleural effusion Recent CVA with resultant lower extremity weakness Pathology consistent with poorly differentiated carcinoma, uncertain primary, LP37 positive Cancer related pain Poor nutritional status  We will proceed as follows; #1 order CT scan of the abdomen/pelvis and bone scan to complete his imaging/staging. Clinically he appears to have a primary lung malignancy. If there is no evidence of suspicious tumor in the kidneys we will then consider sending his primary off for [facility] testing. #2 he will receive IV iron next week given that he has evidence of iron deficiency and does not feel able to tolerate any additional medication. #3 proceed forward with palliative radiation with Dr. Pablo Ledger #4 we will adjust his long-acting morphine up to 45 mg twice daily, he will continue oxycodone for breakthrough. If  needed we will adjust his long-acting morphine to 60 mg twice daily. #5 he will need a nutrition consult. He was provided with a case of Glucerna and instructed to use 2 cans daily to supplement his oral intake. #6 he was given a sheet on constipation and its prevention. This was reviewed with the patient and his wife in detail.  We will see him back once all of the above studies are obtained and then proceed with additional recommendations at that point.  Orders Placed This Encounter  Procedures   CT Abdomen Pelvis Wo Contrast    JH/AMY   [employer] MEDICARE/WILL GET PAC   PT HAS 2ND PART OF BONE SCAN AT 2 PM, OK PER JENNIFER TO ADD ON/PLEASE GIVE READICAT WHEN PT IS FINISHED W/NU MED.    Standing Status: Future     Number of Occurrences: 1     Standing Expiration Date: 08/08/2015    Order Specific Question:  Reason for Exam (SYMPTOM  OR DIAGNOSIS REQUIRED)    Answer:  cancer with indeterminate pathology, need CT abdomen    Order Specific Question:   Preferred imaging location?    Answer:  [facility]   CBC with Differential    Standing Status: Future     Number of Occurrences:      Standing Expiration Date: 08/08/2015   Comprehensive metabolic panel    Standing Status: Future     Number of Occurrences:      Standing Expiration Date: 08/08/2015    All questions were answered. The patient knows to call the clinic with any problems, questions or concerns. We can certainly see the patient much sooner if necessary. This note was electronically signed.  This document serves as a record of services personally performed by Ancil Linsey, MD. It was created on her behalf by Arlyce Harman, a trained medical scribe. The creation of this record is based on the scribe's personal observations and the provider's statements to them. This document has been checked and approved by the attending provider.  I have reviewed the above documentation for accuracy and completeness, and I agree with the above. Molli Hazard, MD

## 2014-08-11 NOTE — Addendum Note (Signed)
Addended by: Kurtis Bushman A on: 08/11/2014 01:33 PM   Modules accepted: Orders

## 2014-08-12 ENCOUNTER — Encounter (HOSPITAL_COMMUNITY)
Admission: RE | Admit: 2014-08-12 | Discharge: 2014-08-12 | Disposition: A | Discharge status: Home / Self Care | Payer: Medicare Other | Source: Ambulatory Visit | Attending: Hematology & Oncology | Admitting: Hematology & Oncology

## 2014-08-12 ENCOUNTER — Encounter (HOSPITAL_COMMUNITY): Payer: Self-pay

## 2014-08-12 ENCOUNTER — Ambulatory Visit (HOSPITAL_COMMUNITY)
Admission: RE | Admit: 2014-08-12 | Discharge: 2014-08-12 | Disposition: A | Discharge status: Home / Self Care | Payer: Medicare Other | Source: Ambulatory Visit | Attending: Hematology & Oncology | Admitting: Hematology & Oncology

## 2014-08-12 DIAGNOSIS — D509 Iron deficiency anemia, unspecified: Secondary | ICD-10-CM | POA: Insufficient documentation

## 2014-08-12 DIAGNOSIS — G8929 Other chronic pain: Secondary | ICD-10-CM | POA: Insufficient documentation

## 2014-08-12 DIAGNOSIS — M545 Low back pain: Secondary | ICD-10-CM | POA: Insufficient documentation

## 2014-08-12 DIAGNOSIS — C3411 Malignant neoplasm of upper lobe, right bronchus or lung: Secondary | ICD-10-CM | POA: Insufficient documentation

## 2014-08-12 DIAGNOSIS — Z87891 Personal history of nicotine dependence: Secondary | ICD-10-CM | POA: Insufficient documentation

## 2014-08-12 DIAGNOSIS — R918 Other nonspecific abnormal finding of lung field: Secondary | ICD-10-CM

## 2014-08-12 MED ORDER — TECHNETIUM TC 99M MEDRONATE IV KIT
25.0000 | PACK | Freq: Once | INTRAVENOUS | Status: AC | PRN
  Administered 2014-08-12: 25 via INTRAVENOUS

## 2014-08-13 ENCOUNTER — Institutional Professional Consult (permissible substitution) (INDEPENDENT_AMBULATORY_CARE_PROVIDER_SITE_OTHER): Payer: Medicare Other | Admitting: Surgery

## 2014-08-13 ENCOUNTER — Other Ambulatory Visit: Payer: Self-pay

## 2014-08-13 ENCOUNTER — Other Ambulatory Visit (HOSPITAL_COMMUNITY): Payer: Medicare Other

## 2014-08-13 ENCOUNTER — Encounter (HOSPITAL_COMMUNITY): Payer: Self-pay | Admitting: Hematology & Oncology

## 2014-08-13 VITALS — BP 108/69 | HR 118 | Resp 18 | Ht 74.0 in | Wt 163.0 lb

## 2014-08-13 DIAGNOSIS — C3411 Malignant neoplasm of upper lobe, right bronchus or lung: Secondary | ICD-10-CM

## 2014-08-13 HISTORY — PX: PERIPHERALLY INSERTED CENTRAL CATHETER INSERTION: SHX2221

## 2014-08-13 NOTE — Progress Notes (Signed)
-  Rescheduled-  KEFALAS,THOMAS 08/21/2014

## 2014-08-13 NOTE — Assessment & Plan Note (Signed)
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  Further testing is pending.  Currently on ASA 81 mg and I have asked him to increase it to 325 mg daily for antiplatelet treatment for history of stroke in the setting of malignancy.  Return in 2 weeks for follow-up.

## 2014-08-14 ENCOUNTER — Ambulatory Visit (HOSPITAL_COMMUNITY): Payer: Medicare Other

## 2014-08-15 ENCOUNTER — Other Ambulatory Visit (HOSPITAL_COMMUNITY)
Admission: RE | Admit: 2014-08-15 | Discharge: 2014-08-15 | Disposition: A | Discharge status: Home / Self Care | Payer: Medicare Other | Source: Ambulatory Visit | Attending: Hematology & Oncology | Admitting: Hematology & Oncology

## 2014-08-15 ENCOUNTER — Encounter (HOSPITAL_COMMUNITY): Payer: Medicare Other | Attending: Hematology & Oncology

## 2014-08-15 ENCOUNTER — Ambulatory Visit (HOSPITAL_COMMUNITY): Payer: Medicare Other | Admitting: Oncology

## 2014-08-15 ENCOUNTER — Other Ambulatory Visit (HOSPITAL_COMMUNITY): Payer: Self-pay | Admitting: Oncology

## 2014-08-15 ENCOUNTER — Encounter (HOSPITAL_COMMUNITY): Payer: Self-pay | Admitting: Oncology

## 2014-08-15 ENCOUNTER — Encounter (HOSPITAL_COMMUNITY): Payer: Medicare Other | Attending: Oncology

## 2014-08-15 ENCOUNTER — Encounter (HOSPITAL_BASED_OUTPATIENT_CLINIC_OR_DEPARTMENT_OTHER): Payer: Medicare Other | Admitting: Oncology

## 2014-08-15 VITALS — BP 91/60 | HR 105 | Temp 98.2°F | Resp 18 | Wt 160.0 lb

## 2014-08-15 DIAGNOSIS — C3411 Malignant neoplasm of upper lobe, right bronchus or lung: Secondary | ICD-10-CM | POA: Diagnosis present

## 2014-08-15 DIAGNOSIS — Z87891 Personal history of nicotine dependence: Secondary | ICD-10-CM | POA: Insufficient documentation

## 2014-08-15 DIAGNOSIS — D509 Iron deficiency anemia, unspecified: Secondary | ICD-10-CM

## 2014-08-15 DIAGNOSIS — R918 Other nonspecific abnormal finding of lung field: Secondary | ICD-10-CM | POA: Diagnosis not present

## 2014-08-15 DIAGNOSIS — C7951 Secondary malignant neoplasm of bone: Secondary | ICD-10-CM

## 2014-08-15 DIAGNOSIS — C7800 Secondary malignant neoplasm of unspecified lung: Secondary | ICD-10-CM

## 2014-08-15 DIAGNOSIS — C801 Malignant (primary) neoplasm, unspecified: Secondary | ICD-10-CM

## 2014-08-15 LAB — COMPREHENSIVE METABOLIC PANEL
ALBUMIN: 2.3 g/dL — AB (ref 3.5–5.0)
ALT: 12 U/L — ABNORMAL LOW (ref 17–63)
AST: 21 U/L (ref 15–41)
Alkaline Phosphatase: 100 U/L (ref 38–126)
Anion gap: 13 (ref 5–15)
BUN: 11 mg/dL (ref 6–20)
CO2: 25 mmol/L (ref 22–32)
Calcium: 9.5 mg/dL (ref 8.9–10.3)
Chloride: 99 mmol/L — ABNORMAL LOW (ref 101–111)
Creatinine, Ser: 0.7 mg/dL (ref 0.61–1.24)
GFR calc non Af Amer: >60 mL/min (ref >60)
GLUCOSE: 100 mg/dL — AB (ref 65–99)
Potassium: 4.2 mmol/L (ref 3.5–5.1)
SODIUM: 137 mmol/L (ref 135–145)
TOTAL PROTEIN: 8.9 g/dL — AB (ref 6.5–8.1)
Total Bilirubin: 0.6 mg/dL (ref 0.3–1.2)

## 2014-08-15 LAB — CBC WITH DIFFERENTIAL/PLATELET
Basophils Absolute: 0 K/uL (ref 0.0–0.1)
Basophils Relative: 0 % (ref 0–1)
Eosinophils Absolute: 0.1 K/uL (ref 0.0–0.7)
Eosinophils Relative: 1 % (ref 0–5)
HCT: 31.9 % — ABNORMAL LOW (ref 39.0–52.0)
Hemoglobin: 9.4 g/dL — ABNORMAL LOW (ref 13.0–17.0)
Lymphocytes Relative: 15 % (ref 12–46)
Lymphs Abs: 2.2 K/uL (ref 0.7–4.0)
MCH: 20.8 pg — ABNORMAL LOW (ref 26.0–34.0)
MCHC: 29.5 g/dL — ABNORMAL LOW (ref 30.0–36.0)
MCV: 70.4 fL — ABNORMAL LOW (ref 78.0–100.0)
Monocytes Absolute: 1.9 K/uL — ABNORMAL HIGH (ref 0.1–1.0)
Monocytes Relative: 13 % — ABNORMAL HIGH (ref 3–12)
Neutro Abs: 10.2 K/uL — ABNORMAL HIGH (ref 1.7–7.7)
Neutrophils Relative %: 71 % (ref 43–77)
Platelets: 899 K/uL — ABNORMAL HIGH (ref 150–400)
RBC: 4.53 MIL/uL (ref 4.22–5.81)
RDW: 22.5 % — ABNORMAL HIGH (ref 11.5–15.5)
WBC Morphology: INCREASED
WBC: 14.4 K/uL — ABNORMAL HIGH (ref 4.0–10.5)

## 2014-08-15 MED ORDER — SODIUM CHLORIDE 0.9 % IV SOLN
510.0000 mg | Freq: Once | INTRAVENOUS | Status: AC
  Administered 2014-08-15: 510 mg via INTRAVENOUS
  Filled 2014-08-15: qty 17

## 2014-08-15 MED ORDER — SODIUM CHLORIDE 0.9 % IV SOLN
INTRAVENOUS | Status: DC
  Administered 2014-08-15: 14:00:00 via INTRAVENOUS

## 2014-08-15 NOTE — Progress Notes (Signed)
Bradley Molina Tolerated iron infusion discharged ambulatory

## 2014-08-15 NOTE — Progress Notes (Signed)
Bradley Molina Tolerated iron infusion well discharged ambulatory

## 2014-08-15 NOTE — Patient Instructions (Addendum)
[place] at [facility]  Discharge Instructions:  IV iron infusion today as planned.   Increase Aspirin to 325 mg daily.  If using baby aspirin (81 mg) take 4 tablets daily to give you the dose of 324 mg daily.  Continue pain medication as ordered.  Please call the clinic on Tuesday if pain is not improved.   Return in 7- 14 days for follow-up.  Please call in the meantime with any questions or concerns. _______________________________________________________________  Thank you for choosing [place] at [facility] to provide your oncology and hematology care.  To afford each patient quality time with our providers, please arrive at least 15 minutes before your scheduled appointment.  You need to re-schedule your appointment if you arrive 10 or more minutes late.  We strive to give you quality time with our providers, and arriving late affects you and other patients whose appointments are after yours.  Also, if you no show three or more times for appointments you may be dismissed from the clinic.  Again, thank you for choosing [place] at [place] hope is that these requests will allow you access to exceptional care and in a timely manner. _______________________________________________________________  If you have questions after your visit, please contact our office at [phone] between the hours of 8:30 a.m. and 5:00 p.m. Voicemails left after 4:30 p.m. will not be returned until the following business day. _______________________________________________________________  For prescription refill requests, have your pharmacy contact our office. _______________________________________________________________  Recommendations made by the consultant and any test results will be sent to your referring physician. _______________________________________________________________

## 2014-08-15 NOTE — Assessment & Plan Note (Signed)
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  Suspect lung primary.  Negative CT abd/pelvis for disease below the diaphragm.    Oncology history developed.  S/P 2 unit PRBC on 5/26  IV feraheme 510 mg today for iron deficiency in the setting of multifactorial anemia.  Increase ASA to 325 mg daily (or 4 baby ASA).  Continue current pain regimen.  Started 60 mg of MS Contin every 12 hours today.  Will re-evaluate pain control next week. He is invited to call us on Monday or Tuesday with an update on pain control.   Radiation therapy started today.  He will continue palliative radiation as planned as we develop future treatment recommendations.  Will discuss with Dr. Whitney Muse.  Will consider [facility] testing versus [facility] testing for unknown primary of specimen if not already sent.  Will monitor weight.  I think this will improve with better pain control.  Return next week for pain control re-evaluation, weight check, and follow-up.

## 2014-08-15 NOTE — Progress Notes (Signed)
No primary care provider on file. No primary provider on file.  Iron deficiency anemia - Plan: ferumoxytol (FERAHEME) 510 mg in sodium chloride 0.9 % 100 mL IVPB  Malignant neoplasm of upper lobe of right lung - Plan: CBC with Differential, Comprehensive metabolic panel  CURRENT THERAPY: Palliative radiation  INTERVAL HISTORY: Bradley Molina 59 y.o. male returns for followup of right upper lobe mass with right pleural effusion, initially referred to Hospice, but reporting to [facility] for second opinion regarding possible treatment options.    Lung cancer   08/04/2014 Pathology Results Diagnosis Lung, needle/core biopsy(ies), right upper lobe apical mass - POSITIVE FOR POORLY DIFFERENTIATED CARCINOMA.   08/07/2014 Treatment Plan Change 2 unit PRBC   08/15/2014 -  Radiation Therapy    I personally reviewed and went over pathology results with the patient.  Review of pathology is noted, but does not shed too much light on specific diagnosis in regards to primary site of disease.  Thus, it has been sent for further testing to identify primary site.   I personally reviewed and went over radiographic studies with the patient.  The results are noted within this dictation.  CT abd/pelvis is unimpressive, and bone scan demonstrates a single abnormality in the right #1 rib that is concerning for focus of metastatic disease.  He has been evaluated for palliative radiation.  He started palliative radiation this AM.  He reports that his pain is improved, but could be better.  He just started MS contin 60 mg q 12 hrs this AM so before any changes in his pain regimen is made, he needs to take MS Contin for a few days to evaluate effectiveness of dose.  He will contact us on Mon/Tues if pain is not better controlled.  He is due for IV iron today for iron deficiency after receiving 2 units of PRBCs on 5/26.  We have had some unusual reactions to ferric gluconate over the past 2 weeks and  therefore, this order is D/C'd.  I will give 1 dose of IV Feraheme instead.  Order signed.    He is currently taking 1 baby ASA daily.  I have asked him to increase that to 4 tablets daily to be at full dose.  He is agreeable to this.  When his Baby ASA Rx is complete, he can take 1 tablet of 325 mg of ASA.  He reports that his appetite is improved some and I will need to monitor closely.  I think once his pain is improved, his appetite will improve, but only time will tell.    Past Medical History  Diagnosis Date  . Lung cancer   . Arthritis   . Hypertension   . Allergy   . COPD (chronic obstructive pulmonary disease)   . Diabetes mellitus without complication   . Stroke   . Hyperlipidemia     has Lung cancer on his problem list.     is allergic to penicillins.  Current Outpatient Prescriptions on File Prior to Visit  Medication Sig Dispense Refill  . ammonium lactate (AMLACTIN) 12 % cream Apply topically as needed for dry skin.    Marland Kitchen aspirin 81 MG tablet Take 81 mg by mouth daily.    . benazepril-hydrochlorthiazide (LOTENSIN HCT) 20-25 MG per tablet Take 1 tablet by mouth daily.    Marland Kitchen HYDROcodone-acetaminophen (NORCO) 10-325 MG per tablet Take 1 tablet by mouth every 6 (six) hours as needed for moderate pain.    Marland Kitchen  metFORMIN (GLUCOPHAGE) 500 MG tablet Take 500 mg by mouth 2 (two) times daily with a meal.    . morphine (MS CONTIN) 60 MG 12 hr tablet Take 1 tablet (60 mg total) by mouth every 12 (twelve) hours. 60 tablet 0  . potassium chloride SA (K-DUR,KLOR-CON) 20 MEQ tablet Take 2 tablets (40 mEq total) by mouth 2 (two) times daily. 60 tablet 0  . amLODipine (NORVASC) 10 MG tablet      No current facility-administered medications on file prior to visit.    Past Surgical History  Procedure Laterality Date  . Pleurx catheter      right side    Denies any headaches, dizziness, double vision, fevers, chills, night sweats, nausea, vomiting, diarrhea, constipation, chest pain,  heart palpitations, shortness of breath, blood in stool, black tarry stool, urinary pain, urinary burning, urinary frequency, hematuria.   PHYSICAL EXAMINATION  ECOG PERFORMANCE STATUS: 2 - Symptomatic, <50% confined to bed  Filed Vitals:   08/15/14 1257  BP: 101/62  Pulse: 118  Temp: 98.4 F (36.9 C)  Resp: 20    GENERAL:alert, no distress, cachectic and tired, accompanied by wife Ivin Booty. SKIN: skin color, texture, turgor are normal, no rashes or significant lesions HEAD: Normocephalic, No masses, lesions, tenderness or abnormalities EYES: normal, PERRLA, EOMI, Conjunctiva are pink and non-injected EARS: External ears normal OROPHARYNX:lips, buccal mucosa, and tongue normal and mucous membranes are moist  NECK: supple, trachea midline LYMPH:  not examined BREAST:not examined LUNGS: decreased breath sounds HEART: regular rate & rhythm ABDOMEN:abdomen soft and normal bowel sounds BACK: Back symmetric, no curvature. EXTREMITIES:less then 2 second capillary refill, no skin discoloration  NEURO: alert & oriented x 3 with fluent speech, no focal motor/sensory deficits   LABORATORY DATA: CBC    Component Value Date/Time   WBC 14.4* 08/15/2014 1256   RBC 4.53 08/15/2014 1256   HGB 9.4* 08/15/2014 1256   HCT 31.9* 08/15/2014 1256   PLT 899* 08/15/2014 1256   MCV 70.4* 08/15/2014 1256   MCH 20.8* 08/15/2014 1256   MCHC 29.5* 08/15/2014 1256   RDW 22.5* 08/15/2014 1256   LYMPHSABS PENDING 08/15/2014 1256   MONOABS PENDING 08/15/2014 1256   EOSABS PENDING 08/15/2014 1256   BASOSABS PENDING 08/15/2014 1256      Chemistry      Component Value Date/Time   NA 135 07/31/2014 1619   K 3.0* 07/31/2014 1619   CL 93* 07/31/2014 1619   CO2 31 07/31/2014 1619   BUN 10 07/31/2014 1619   CREATININE 0.58* 07/31/2014 1619      Component Value Date/Time   CALCIUM 9.1 07/31/2014 1619   ALKPHOS 69 07/31/2014 1619   AST 23 07/31/2014 1619   ALT 11* 07/31/2014 1619   BILITOT 0.5  07/31/2014 1619       RADIOGRAPHIC STUDIES:  Ct Abdomen Pelvis Wo Contrast  08/13/2014   CLINICAL DATA:  Initial encounter for right upper lobe lung cancer.  EXAM: CT ABDOMEN AND PELVIS WITHOUT CONTRAST  TECHNIQUE: Multidetector CT imaging of the abdomen and pelvis was performed following the standard protocol without IV contrast.  COMPARISON:  None.  FINDINGS: Lower chest: Right-sided chest to visualize with distal tip in the posterior right costophrenic sulcus. 12 mm left lower lobe pulmonary nodule has an adjacent smaller left lower lobe nodule. Small right pleural effusion evident. There is a small pericardial effusion.  Hepatobiliary: No focal abnormality in the liver on this study without intravenous contrast. No evidence for hepatomegaly. Gallbladder is contracted. No intrahepatic  or extrahepatic biliary dilation.  Pancreas: No focal mass lesion. No dilatation of the main duct. No intraparenchymal cyst. No peripancreatic edema.  Spleen: No splenomegaly. No focal mass lesion.  Adrenals/Urinary Tract: No adrenal nodule or mass. No gross lesion evident within either kidney on this noncontrast study. No hydronephrosis. No evidence for hydroureter. Urinary bladder is unremarkable.  Stomach/Bowel: Stomach is nondistended. No gastric wall thickening. No evidence of outlet obstruction. Duodenum is normally positioned as is the ligament of Treitz. No small bowel wall thickening. No small bowel dilatation. Terminal ileum not well seen. Appendix is normal. Large stool volume throughout the length of the colon.  Vascular/Lymphatic: There is abdominal aortic atherosclerosis without aneurysm. No lymphadenopathy in the abdomen or pelvis.  Reproductive: Prostate appears mildly enlarged. Seminal vesicles unremarkable.  Other: No intraperitoneal free fluid.  Musculoskeletal: Bone windows reveal no worrisome lytic or sclerotic osseous lesions.  IMPRESSION: 1. Right-sided chest tube with small right pleural effusion. 2.  Left lower lobe pulmonary nodules measuring up to 12 mm. 3. Trace to small pericardial effusion. 4. No evidence for metastatic disease in the abdomen or pelvis on this study performed without intravenous contrast.   Electronically Signed   By: Misty Stanley M.D.   On: 08/13/2014 09:38   Dg Chest 1 View  08/04/2014   CLINICAL DATA:  Status post right apical lung biopsy.  EXAM: CHEST  1 VIEW  COMPARISON:  CT images from the procedure.  FINDINGS: Right apical and right hilar pulmonary mass lesions are noted. There is also severe emphysema. A PleurX drainage catheter is noted on the right side. No postprocedural Norma thorax is demonstrated.  IMPRESSION: Status post biopsy without postprocedural pneumothorax.   Electronically Signed   By: Marijo Sanes M.D.   On: 08/04/2014 14:48   Nm Bone Scan Whole Body  08/12/2014   CLINICAL DATA:  Recent diagnosis of right upper lobe lung cancer (Pancoast tumor). Chronic low back pain for approximately 7 years. Staging.  EXAM: NUCLEAR MEDICINE WHOLE BODY BONE SCAN  TECHNIQUE: Whole body anterior and posterior images were obtained approximately 3 hours after intravenous injection of radiopharmaceutical.  RADIOPHARMACEUTICALS:  24.6 mCi Technetium-37mMDP IV  COMPARISON:  None.  FINDINGS: Increased activity involving the right anterior 1st rib. Increased activity involving the right maxilla. No abnormal activity to suggest metastatic disease elsewhere. Symmetric activity involving the 1st MTP joints bilaterally, the acromioclavicular joints bilaterally, and the forefeet and mid feet bilaterally. Urinary tract activity related to physiologic excretion.  IMPRESSION: 1. Activity involving the right anterior 1st rib, likely secondary to tumor involvement. 2. No evidence of metastatic disease elsewhere. 3. Activity in the right side of the maxilla, more likely related to dental disease than metastatic disease. 4. Degenerative activity involving the 1st MTP joints, the  acromioclavicular joints, the forefeet and the mid feet bilaterally.   Electronically Signed   By: TEvangeline DakinM.D.   On: 08/12/2014 15:24   Ct Biopsy  08/04/2014   CLINICAL DATA:  59year old male with symptomatic right apical Pancoast tumor. CT-guided biopsy is warranted for tissue diagnosis to initiate therapy.  EXAM: CT BIOPSY  Date: 08/04/2014  PROCEDURE: 1. CT-guided fine needle aspiration biopsy right apical mass 2. CT-guided core biopsy right apical mass Interventional Radiologist:  HCriselda Peaches MD  ANESTHESIA/SEDATION: Moderate (conscious) sedation was used. 2 mg Versed, 50 mcg Fentanyl were administered intravenously. The patient's vital signs were monitored continuously by radiology nursing throughout the procedure.  Sedation Time: 48 minutes  MEDICATIONS: None additional  TECHNIQUE: Informed consent was obtained from the patient following explanation of the procedure, risks, benefits and alternatives. The patient understands, agrees and consents for the procedure. All questions were addressed. A time out was performed.  A planning axial CT scan was performed. The right apical lung mass was successfully identified. A suitable skin entry site was selected and marked. The region was sterilely prepped and draped in standard fashion using Betadine skin prep.  Local anesthesia was attained by infiltration with 1% lidocaine. A small dermatotomy was made. Under real-time crash that under intermittent CT fluoroscopic guidance, a 17 gauge introducer needle was advanced into the margin of the mass. Multiple 20 gauge fine needle aspirate biopsies were then obtained using Francine the needles.  Biopsy specimens demonstrated scant cellular material due to excessive hemorrhage. Therefore, multiple 18 gauge core biopsies were coaxially obtained using the bio Pince automated biopsy device. The introducer needle was removed. Post biopsy axial CT imaging demonstrates no evidence of pneumothorax or other  acute complication. The patient tolerated the procedure well.  COMPLICATIONS: None  IMPRESSION: Technically successful CT-guided core biopsy right apical pulmonary mass.  Signed,  Criselda Peaches, MD  [facility]  [facility]   Electronically Signed   By: Jacqulynn Cadet M.D.   On: 08/04/2014 13:35     PATHOLOGY:  Diagnosis Lung, needle/core biopsy(ies), right upper lobe apical mass - POSITIVE FOR POORLY DIFFERENTIATED CARCINOMA.    ASSESSMENT AND PLAN:  Lung cancer Probable Stage IV malignancy, primary unknown at this time, with 1st right rib involvement and lung mass.  Suspect lung primary.  Negative CT abd/pelvis for disease below the diaphragm.    Oncology history developed.  S/P 2 unit PRBC on 5/26  IV feraheme 510 mg today for iron deficiency in the setting of multifactorial anemia.  Increase ASA to 325 mg daily (or 4 baby ASA).  Continue current pain regimen.  Started 60 mg of MS Contin every 12 hours today.  Will re-evaluate pain control next week. He is invited to call us on Monday or Tuesday with an update on pain control.   Radiation therapy started today.  He will continue palliative radiation as planned as we develop future treatment recommendations.  Will discuss with Dr. Whitney Muse.  Will consider [facility] testing versus [facility] testing for unknown primary of specimen if not already sent.  Will monitor weight.  I think this will improve with better pain control.  Return next week for pain control re-evaluation, weight check, and follow-up.   THERAPY PLAN:  Continue palliative radiation therapy.  All questions were answered. The patient knows to call the clinic with any problems, questions or concerns. We can certainly see the patient much sooner if necessary.  Patient and plan discussed with Dr. Ancil Linsey and she is in agreement with the aforementioned.   This note is electronically signed by:  Doy Mince 08/15/2014 1:46 PM

## 2014-08-17 ENCOUNTER — Encounter: Payer: Self-pay | Admitting: Surgery

## 2014-08-17 NOTE — Progress Notes (Addendum)
Cardiothoracic Surgery Consultation  PCP is [name], MD Referring Provider is [name], MD  Chief Complaint  Patient presents with  . Lung Cancer    RULobe per CT BX, CT CHEST, BONE SCAN, CT A/P.Marland Kitchen5/31/16    HPI:  The patient is a 59 year old gentleman with a history of active smoking, COPD, DM, HTN, HLD, and stroke who was admitted to [facility] in early April 2016 with shortness of breath and a 40 lb wt loss and was found to have a large RUL lung mass with mediastinal and hilar adenopathy and a large right pleural effusion. He reportedly had a thoracentesis performed removing 1.6 L of fluid that was negative for malignant cells and subsequently had a right PleurX catheter inserted at [facility]. There is some mention of this being a malignant effusion but I don't have access to any of the cytology reports. He was then reportedly discharged to hospice with no tissue diagnosis and readmitted to [facility] on 4/18 with a stroke with right sided weakness. He did undergo CT guided bx of the RUL lung mass here on 5/23. The pathology showed poorly differentiated carcinoma of presumed lung origin although the immunohistochemical studies did not clearly show this with CD10 positivity suggesting the possibility of renal origin. A CT of the abdomen showed no renal lesions and no sign of metastatic disease in the abdomen or pelvis.   Past Medical History  Diagnosis Date  . Lung cancer   . Arthritis   . Hypertension   . Allergy   . COPD (chronic obstructive pulmonary disease)   . Diabetes mellitus without complication   . Stroke   . Hyperlipidemia     Past Surgical History  Procedure Laterality Date  . Pleurx catheter      right side    Family History  Problem Relation Age of Onset  . Cancer Mother   . COPD Mother   . Emphysema Father     Social History History  Substance Use Topics  . Smoking status: Former Smoker -- 1.00 packs/day for 40 years    Types:  Cigarettes    Quit date: 06/17/2014  . Smokeless tobacco: Not on file  . Alcohol Use: No    Current Outpatient Prescriptions  Medication Sig Dispense Refill  . amLODipine (NORVASC) 10 MG tablet     . ammonium lactate (AMLACTIN) 12 % cream Apply topically as needed for dry skin.    Marland Kitchen aspirin 81 MG tablet Take 81 mg by mouth daily.    . benazepril-hydrochlorthiazide (LOTENSIN HCT) 20-25 MG per tablet Take 1 tablet by mouth daily.    Marland Kitchen HYDROcodone-acetaminophen (NORCO) 10-325 MG per tablet Take 1 tablet by mouth every 6 (six) hours as needed for moderate pain.    . metFORMIN (GLUCOPHAGE) 500 MG tablet Take 500 mg by mouth 2 (two) times daily with a meal.    . morphine (MS CONTIN) 60 MG 12 hr tablet Take 1 tablet (60 mg total) by mouth every 12 (twelve) hours. 60 tablet 0  . potassium chloride SA (K-DUR,KLOR-CON) 20 MEQ tablet Take 2 tablets (40 mEq total) by mouth 2 (two) times daily. 60 tablet 0   No current facility-administered medications for this visit.    Allergies  Allergen Reactions  . Penicillins Hives    Review of Systems  Constitutional: Positive for appetite change, fatigue and unexpected weight change.  HENT: Negative.   Eyes: Negative.   Respiratory: Positive for cough and shortness of breath.  Cardiovascular: Negative.   Gastrointestinal: Negative.   Endocrine: Negative.   Genitourinary: Negative.   Musculoskeletal:       Pain right upper back and shoulder.    Skin: Negative.   Neurological:       Right sided weakness due to recent stroke.  Hematological: Negative.   Psychiatric/Behavioral: Negative.     BP 108/69 mmHg  Pulse 118  Resp 18  Ht '6\' 2"'$  (1.88 m)  Wt 163 lb (73.936 kg)  BMI 20.92 kg/m2  SpO2 96% Physical Exam  Constitutional:  Chronically ill- appearing gentleman in no distress.  Eyes: EOM are normal. Pupils are equal, round, and reactive to light.  Neck: No JVD present. No thyromegaly present.  Cardiovascular: Normal rate, regular  rhythm and normal heart sounds.   No murmur heard. Pulmonary/Chest: Effort normal. He exhibits no tenderness.  Distant breath sounds throughout  Abdominal: Soft. Bowel sounds are normal. He exhibits no distension and no mass. There is no tenderness.  Musculoskeletal: Normal range of motion. He exhibits no edema.  Lymphadenopathy:    He has no cervical adenopathy.  Neurological: He is alert.  Slow gait.  Strength seems symmetrical  Skin: Skin is warm and dry.  Psychiatric:  Slow mentation     Diagnostic Tests:  CLINICAL DATA: Recent diagnosis of right upper lobe lung cancer (Pancoast tumor). Chronic low back pain for approximately 7 years. Staging.  EXAM: NUCLEAR MEDICINE WHOLE BODY BONE SCAN  TECHNIQUE: Whole body anterior and posterior images were obtained approximately 3 hours after intravenous injection of radiopharmaceutical.  RADIOPHARMACEUTICALS: 24.6 mCi Technetium-25mMDP IV  COMPARISON: None.  FINDINGS: Increased activity involving the right anterior 1st rib. Increased activity involving the right maxilla. No abnormal activity to suggest metastatic disease elsewhere. Symmetric activity involving the 1st MTP joints bilaterally, the acromioclavicular joints bilaterally, and the forefeet and mid feet bilaterally. Urinary tract activity related to physiologic excretion.  IMPRESSION: 1. Activity involving the right anterior 1st rib, likely secondary to tumor involvement. 2. No evidence of metastatic disease elsewhere. 3. Activity in the right side of the maxilla, more likely related to dental disease than metastatic disease. 4. Degenerative activity involving the 1st MTP joints, the acromioclavicular joints, the forefeet and the mid feet bilaterally.   Electronically Signed  By: TEvangeline DakinM.D.  On: 08/12/2014 15:24           Vitals     Height Weight BMI (Calculated)    '6\' 2"'$  (1.88 m) 160 lb (72.576 kg) 21       Interpretation Summary     CLINICAL DATA: Recent diagnosis of right upper lobe lung cancer (Pancoast tumor). Chronic low back pain for approximately 7 years. Staging.  EXAM: NUCLEAR MEDICINE WHOLE BODY BONE SCAN  TECHNIQUE: Whole body anterior and posterior images were obtained approximately 3 hours after intravenous injection of radiopharmaceutical.  RADIOPHARMACEUTICALS: 24.6 mCi Technetium-97mDP IV  COMPARISON: None.  FINDINGS: Increased activity involving the right anterior 1st rib. Increased activity involving the right maxilla. No abnormal activity to suggest metastatic disease elsewhere. Symmetric activity involving the 1st MTP joints bilaterally, the acromioclavicular joints bilaterally, and the forefeet and mid feet bilaterally. Urinary tract activity related to physiologic excretion.  IMPRESSION: 1. Activity involving the right anterior 1st rib, likely secondary to tumor involvement. 2. No evidence of metastatic disease elsewhere. 3. Activity in the right side of the maxilla, more likely related to dental disease than metastatic disease. 4. Degenerative activity involving the 1st MTP joints, the acromioclavicular joints, the forefeet  and the mid feet bilaterally.   Electronically Signed  By: Evangeline Dakin M.D.  On: 08/12/2014 15:24    CLINICAL DATA: Initial encounter for right upper lobe lung cancer.  EXAM: CT ABDOMEN AND PELVIS WITHOUT CONTRAST  TECHNIQUE: Multidetector CT imaging of the abdomen and pelvis was performed following the standard protocol without IV contrast.  COMPARISON: None.  FINDINGS: Lower chest: Right-sided chest to visualize with distal tip in the posterior right costophrenic sulcus. 12 mm left lower lobe pulmonary nodule has an adjacent smaller left lower lobe nodule. Small right pleural effusion evident. There is a small pericardial effusion.  Hepatobiliary: No focal abnormality in the liver on this  study without intravenous contrast. No evidence for hepatomegaly. Gallbladder is contracted. No intrahepatic or extrahepatic biliary dilation.  Pancreas: No focal mass lesion. No dilatation of the main duct. No intraparenchymal cyst. No peripancreatic edema.  Spleen: No splenomegaly. No focal mass lesion.  Adrenals/Urinary Tract: No adrenal nodule or mass. No gross lesion evident within either kidney on this noncontrast study. No hydronephrosis. No evidence for hydroureter. Urinary bladder is unremarkable.  Stomach/Bowel: Stomach is nondistended. No gastric wall thickening. No evidence of outlet obstruction. Duodenum is normally positioned as is the ligament of Treitz. No small bowel wall thickening. No small bowel dilatation. Terminal ileum not well seen. Appendix is normal. Large stool volume throughout the length of the colon.  Vascular/Lymphatic: There is abdominal aortic atherosclerosis without aneurysm. No lymphadenopathy in the abdomen or pelvis.  Reproductive: Prostate appears mildly enlarged. Seminal vesicles unremarkable.  Other: No intraperitoneal free fluid.  Musculoskeletal: Bone windows reveal no worrisome lytic or sclerotic osseous lesions.  IMPRESSION: 1. Right-sided chest tube with small right pleural effusion. 2. Left lower lobe pulmonary nodules measuring up to 12 mm. 3. Trace to small pericardial effusion. 4. No evidence for metastatic disease in the abdomen or pelvis on this study performed without intravenous contrast.   Electronically Signed  By: Misty Stanley M.D.  On: 08/13/2014 09:38   Impression:  This 58 year old gentleman has a large RUL apical lung mass extending to the mediastinum with mediastinal and hilar adenopathy, destruction of the right first rib and a large pleural effusion that I presume is malignant. I am able to review the CT scan from his needle biopsy but not his original chest CT that was done at [facility]. He  has not had a PET scan. He was sent to me for a surgical opinion. This is clearly not an operable situation and reading through the notes in EPIC he has already been seen by Medical Oncology and is going to be referred for palliative XRT.   Plan:  He will continue follow up with medical oncology.  Gaye Pollack, MD [facility] [phone]

## 2014-08-22 ENCOUNTER — Encounter (HOSPITAL_COMMUNITY): Payer: Self-pay | Admitting: Oncology

## 2014-08-22 ENCOUNTER — Encounter (HOSPITAL_BASED_OUTPATIENT_CLINIC_OR_DEPARTMENT_OTHER): Payer: Medicare Other | Admitting: Oncology

## 2014-08-22 ENCOUNTER — Encounter (HOSPITAL_COMMUNITY): Payer: Medicare Other | Attending: Hematology & Oncology

## 2014-08-22 VITALS — BP 98/63 | HR 116 | Temp 98.4°F | Resp 18 | Wt 158.6 lb

## 2014-08-22 DIAGNOSIS — C801 Malignant (primary) neoplasm, unspecified: Secondary | ICD-10-CM | POA: Diagnosis not present

## 2014-08-22 DIAGNOSIS — J90 Pleural effusion, not elsewhere classified: Secondary | ICD-10-CM | POA: Diagnosis not present

## 2014-08-22 DIAGNOSIS — C3411 Malignant neoplasm of upper lobe, right bronchus or lung: Secondary | ICD-10-CM

## 2014-08-22 DIAGNOSIS — R918 Other nonspecific abnormal finding of lung field: Secondary | ICD-10-CM | POA: Diagnosis not present

## 2014-08-22 DIAGNOSIS — R634 Abnormal weight loss: Secondary | ICD-10-CM

## 2014-08-22 DIAGNOSIS — C7801 Secondary malignant neoplasm of right lung: Secondary | ICD-10-CM

## 2014-08-22 LAB — CBC WITH DIFFERENTIAL/PLATELET
BASOS ABS: 0 10*3/uL (ref 0.0–0.1)
BASOS PCT: 0 % (ref 0–1)
Eosinophils Absolute: 0.1 10*3/uL (ref 0.0–0.7)
Eosinophils Relative: 1 % (ref 0–5)
HCT: 30.7 % — ABNORMAL LOW (ref 39.0–52.0)
Hemoglobin: 8.9 g/dL — ABNORMAL LOW (ref 13.0–17.0)
Lymphocytes Relative: 12 % (ref 12–46)
Lymphs Abs: 1.1 10*3/uL (ref 0.7–4.0)
MCH: 20.7 pg — ABNORMAL LOW (ref 26.0–34.0)
MCHC: 29 g/dL — ABNORMAL LOW (ref 30.0–36.0)
MCV: 71.4 fL — ABNORMAL LOW (ref 78.0–100.0)
Monocytes Absolute: 1.4 10*3/uL — ABNORMAL HIGH (ref 0.1–1.0)
Monocytes Relative: 15 % — ABNORMAL HIGH (ref 3–12)
NEUTROS PCT: 72 % (ref 43–77)
Neutro Abs: 6.6 10*3/uL (ref 1.7–7.7)
Platelets: 773 10*3/uL — ABNORMAL HIGH (ref 150–400)
RBC: 4.3 MIL/uL (ref 4.22–5.81)
RDW: 23.4 % — ABNORMAL HIGH (ref 11.5–15.5)
WBC: 9.2 10*3/uL (ref 4.0–10.5)

## 2014-08-22 LAB — COMPREHENSIVE METABOLIC PANEL
ALK PHOS: 89 U/L (ref 38–126)
ALT: 12 U/L — AB (ref 17–63)
AST: 23 U/L (ref 15–41)
Albumin: 2.1 g/dL — ABNORMAL LOW (ref 3.5–5.0)
Anion gap: 11 (ref 5–15)
BILIRUBIN TOTAL: 0.5 mg/dL (ref 0.3–1.2)
BUN: 12 mg/dL (ref 6–20)
CALCIUM: 9.5 mg/dL (ref 8.9–10.3)
CO2: 26 mmol/L (ref 22–32)
CREATININE: 0.69 mg/dL (ref 0.61–1.24)
Chloride: 99 mmol/L — ABNORMAL LOW (ref 101–111)
GFR calc Af Amer: >60 mL/min (ref >60)
GFR calc non Af Amer: >60 mL/min (ref >60)
GLUCOSE: 114 mg/dL — AB (ref 65–99)
Potassium: 4.1 mmol/L (ref 3.5–5.1)
SODIUM: 136 mmol/L (ref 135–145)
Total Protein: 8.5 g/dL — ABNORMAL HIGH (ref 6.5–8.1)

## 2014-08-22 NOTE — Progress Notes (Signed)
LABS DRAWN

## 2014-08-22 NOTE — Patient Instructions (Addendum)
[place] at [facility] Discharge Instructions  RECOMMENDATIONS MADE BY THE CONSULTANT AND ANY TEST RESULTS WILL BE SENT TO YOUR REFERRING PHYSICIAN.  Exam and discussion by Robynn Pane, PA-C Will check labs today Plans are to give you chemotherapy using Carboplatin and Taxol weekly and hopefully begin next Friday. Will get PICC line placed for your chemotherapy  Chemotherapy teaching by Lupita Raider - she will call you with day and time of teaching.  Follow-up in 1 week with Dr. Whitney Muse.    Thank you for choosing [place] at [facility] to provide your oncology and hematology care.  To afford each patient quality time with our provider, please arrive at least 15 minutes before your scheduled appointment time.    You need to re-schedule your appointment should you arrive 10 or more minutes late.  We strive to give you quality time with our providers, and arriving late affects you and other patients whose appointments are after yours.  Also, if you no show three or more times for appointments you may be dismissed from the clinic at the providers discretion.     Again, thank you for choosing [facility].  Our hope is that these requests will decrease the amount of time that you wait before being seen by our physicians.       _____________________________________________________________  Should you have questions after your visit to [facility], please contact our office at [phone] between the hours of 8:30 a.m. and 4:30 p.m.  Voicemails left after 4:30 p.m. will not be returned until the following business day.  For prescription refill requests, have your pharmacy contact our office.   Carboplatin injection What is this medicine? CARBOPLATIN (KAR boe pla tin) is a chemotherapy drug. It targets fast dividing cells, like cancer cells, and causes these cells to die. This medicine is used to treat ovarian cancer and  many other cancers. This medicine may be used for other purposes; ask your health care provider or pharmacist if you have questions. COMMON BRAND NAME(S): Paraplatin What should I tell my health care provider before I take this medicine? They need to know if you have any of these conditions: -blood disorders -hearing problems -kidney disease -recent or ongoing radiation therapy -an unusual or allergic reaction to carboplatin, cisplatin, other chemotherapy, other medicines, foods, dyes, or preservatives -pregnant or trying to get pregnant -breast-feeding How should I use this medicine? This drug is usually given as an infusion into a vein. It is administered in a hospital or clinic by a specially trained health care professional. Talk to your pediatrician regarding the use of this medicine in children. Special care may be needed. Overdosage: If you think you have taken too much of this medicine contact a poison control center or emergency room at once. NOTE: This medicine is only for you. Do not share this medicine with others. What if I miss a dose? It is important not to miss a dose. Call your doctor or health care professional if you are unable to keep an appointment. What may interact with this medicine? -medicines for seizures -medicines to increase blood counts like filgrastim, pegfilgrastim, sargramostim -some antibiotics like amikacin, gentamicin, neomycin, streptomycin, tobramycin -vaccines Talk to your doctor or health care professional before taking any of these medicines: -acetaminophen -aspirin -ibuprofen -ketoprofen -naproxen This list may not describe all possible interactions. Give your health care provider a list of all the medicines, herbs, non-prescription drugs, or  dietary supplements you use. Also tell them if you smoke, drink alcohol, or use illegal drugs. Some items may interact with your medicine. What should I watch for while using this medicine? Your condition  will be monitored carefully while you are receiving this medicine. You will need important blood work done while you are taking this medicine. This drug may make you feel generally unwell. This is not uncommon, as chemotherapy can affect healthy cells as well as cancer cells. Report any side effects. Continue your course of treatment even though you feel ill unless your doctor tells you to stop. In some cases, you may be given additional medicines to help with side effects. Follow all directions for their use. Call your doctor or health care professional for advice if you get a fever, chills or sore throat, or other symptoms of a cold or flu. Do not treat yourself. This drug decreases your body's ability to fight infections. Try to avoid being around people who are sick. This medicine may increase your risk to bruise or bleed. Call your doctor or health care professional if you notice any unusual bleeding. Be careful brushing and flossing your teeth or using a toothpick because you may get an infection or bleed more easily. If you have any dental work done, tell your dentist you are receiving this medicine. Avoid taking products that contain aspirin, acetaminophen, ibuprofen, naproxen, or ketoprofen unless instructed by your doctor. These medicines may hide a fever. Do not become pregnant while taking this medicine. Women should inform their doctor if they wish to become pregnant or think they might be pregnant. There is a potential for serious side effects to an unborn child. Talk to your health care professional or pharmacist for more information. Do not breast-feed an infant while taking this medicine. What side effects may I notice from receiving this medicine? Side effects that you should report to your doctor or health care professional as soon as possible: -allergic reactions like skin rash, itching or hives, swelling of the face, lips, or tongue -signs of infection - fever or chills, cough, sore  throat, pain or difficulty passing urine -signs of decreased platelets or bleeding - bruising, pinpoint red spots on the skin, black, tarry stools, nosebleeds -signs of decreased red blood cells - unusually weak or tired, fainting spells, lightheadedness -breathing problems -changes in hearing -changes in vision -chest pain -high blood pressure -low blood counts - This drug may decrease the number of white blood cells, red blood cells and platelets. You may be at increased risk for infections and bleeding. -nausea and vomiting -pain, swelling, redness or irritation at the injection site -pain, tingling, numbness in the hands or feet -problems with balance, talking, walking -trouble passing urine or change in the amount of urine Side effects that usually do not require medical attention (report to your doctor or health care professional if they continue or are bothersome): -hair loss -loss of appetite -metallic taste in the mouth or changes in taste This list may not describe all possible side effects. Call your doctor for medical advice about side effects. You may report side effects to FDA at 1-800-FDA-1088. Where should I keep my medicine? This drug is given in a hospital or clinic and will not be stored at home. NOTE: This sheet is a summary. It may not cover all possible information. If you have questions about this medicine, talk to your doctor, pharmacist, or health care provider.  2015, [employer] Standard. (2007-06-05 14:38:05) Paclitaxel injection What is  this medicine? PACLITAXEL (PAK li TAX el) is a chemotherapy drug. It targets fast dividing cells, like cancer cells, and causes these cells to die. This medicine is used to treat ovarian cancer, breast cancer, and other cancers. This medicine may be used for other purposes; ask your health care provider or pharmacist if you have questions. COMMON BRAND NAME(S): Onxol, Taxol What should I tell my health care provider before I  take this medicine? They need to know if you have any of these conditions: -blood disorders -irregular heartbeat -infection (especially a virus infection such as chickenpox, cold sores, or herpes) -liver disease -previous or ongoing radiation therapy -an unusual or allergic reaction to paclitaxel, alcohol, polyoxyethylated castor oil, other chemotherapy agents, other medicines, foods, dyes, or preservatives -pregnant or trying to get pregnant -breast-feeding How should I use this medicine? This drug is given as an infusion into a vein. It is administered in a hospital or clinic by a specially trained health care professional. Talk to your pediatrician regarding the use of this medicine in children. Special care may be needed. Overdosage: If you think you have taken too much of this medicine contact a poison control center or emergency room at once. NOTE: This medicine is only for you. Do not share this medicine with others. What if I miss a dose? It is important not to miss your dose. Call your doctor or health care professional if you are unable to keep an appointment. What may interact with this medicine? Do not take this medicine with any of the following medications: -disulfiram -metronidazole This medicine may also interact with the following medications: -cyclosporine -diazepam -ketoconazole -medicines to increase blood counts like filgrastim, pegfilgrastim, sargramostim -other chemotherapy drugs like cisplatin, doxorubicin, epirubicin, etoposide, teniposide, vincristine -quinidine -testosterone -vaccines -verapamil Talk to your doctor or health care professional before taking any of these medicines: -acetaminophen -aspirin -ibuprofen -ketoprofen -naproxen This list may not describe all possible interactions. Give your health care provider a list of all the medicines, herbs, non-prescription drugs, or dietary supplements you use. Also tell them if you smoke, drink alcohol,  or use illegal drugs. Some items may interact with your medicine. What should I watch for while using this medicine? Your condition will be monitored carefully while you are receiving this medicine. You will need important blood work done while you are taking this medicine. This drug may make you feel generally unwell. This is not uncommon, as chemotherapy can affect healthy cells as well as cancer cells. Report any side effects. Continue your course of treatment even though you feel ill unless your doctor tells you to stop. In some cases, you may be given additional medicines to help with side effects. Follow all directions for their use. Call your doctor or health care professional for advice if you get a fever, chills or sore throat, or other symptoms of a cold or flu. Do not treat yourself. This drug decreases your body's ability to fight infections. Try to avoid being around people who are sick. This medicine may increase your risk to bruise or bleed. Call your doctor or health care professional if you notice any unusual bleeding. Be careful brushing and flossing your teeth or using a toothpick because you may get an infection or bleed more easily. If you have any dental work done, tell your dentist you are receiving this medicine. Avoid taking products that contain aspirin, acetaminophen, ibuprofen, naproxen, or ketoprofen unless instructed by your doctor. These medicines may hide a fever. Do not become  pregnant while taking this medicine. Women should inform their doctor if they wish to become pregnant or think they might be pregnant. There is a potential for serious side effects to an unborn child. Talk to your health care professional or pharmacist for more information. Do not breast-feed an infant while taking this medicine. Men are advised not to father a child while receiving this medicine. What side effects may I notice from receiving this medicine? Side effects that you should report to  your doctor or health care professional as soon as possible: -allergic reactions like skin rash, itching or hives, swelling of the face, lips, or tongue -low blood counts - This drug may decrease the number of white blood cells, red blood cells and platelets. You may be at increased risk for infections and bleeding. -signs of infection - fever or chills, cough, sore throat, pain or difficulty passing urine -signs of decreased platelets or bleeding - bruising, pinpoint red spots on the skin, black, tarry stools, nosebleeds -signs of decreased red blood cells - unusually weak or tired, fainting spells, lightheadedness -breathing problems -chest pain -high or low blood pressure -mouth sores -nausea and vomiting -pain, swelling, redness or irritation at the injection site -pain, tingling, numbness in the hands or feet -slow or irregular heartbeat -swelling of the ankle, feet, hands Side effects that usually do not require medical attention (report to your doctor or health care professional if they continue or are bothersome): -bone pain -complete hair loss including hair on your head, underarms, pubic hair, eyebrows, and eyelashes -changes in the color of fingernails -diarrhea -loosening of the fingernails -loss of appetite -muscle or joint pain -red flush to skin -sweating This list may not describe all possible side effects. Call your doctor for medical advice about side effects. You may report side effects to FDA at 1-800-FDA-1088. Where should I keep my medicine? This drug is given in a hospital or clinic and will not be stored at home. NOTE: This sheet is a summary. It may not cover all possible information. If you have questions about this medicine, talk to your doctor, pharmacist, or health care provider.  2015, [employer] Standard. (2012-04-23 16:41:21)

## 2014-08-22 NOTE — Progress Notes (Signed)
No primary care provider on file. No primary provider on file.  Malignant neoplasm of upper lobe of right lung - Plan: CBC with Differential, Comprehensive metabolic panel, Insert PICC line, CBC with Differential, Comprehensive metabolic panel  Weight loss - Plan: Amb Referral to Nutrition and Diabetic E  CURRENT THERAPY: Palliative radiation  INTERVAL HISTORY: Bradley Molina 59 y.o. male returns for followup of right upper lobe mass with right pleural effusion, initially referred to Hospice, but reporting to [facility] for second opinion regarding possible treatment options.    Lung cancer   08/04/2014 Pathology Results Diagnosis Lung, needle/core biopsy(ies), right upper lobe apical mass - POSITIVE FOR POORLY DIFFERENTIATED CARCINOMA.   08/07/2014 Treatment Plan Change 2 unit PRBC   08/15/2014 -  Radiation Therapy     He reports that his pain is improved.  He is taking MS contin 60 mg q 12 hrs with short-acting breakthrough.  He reports his appetite is stable.  He is noted to be down another 2-3 lbs.  I will order nutritionist consult to help with needs.  He notes that his pleural drain is no longer draining.    Past Medical History  Diagnosis Date  . Lung cancer   . Arthritis   . Hypertension   . Allergy   . COPD (chronic obstructive pulmonary disease)   . Diabetes mellitus without complication   . Stroke   . Hyperlipidemia     has Lung cancer on his problem list.     is allergic to penicillins.  Current Outpatient Prescriptions on File Prior to Visit  Medication Sig Dispense Refill  . ammonium lactate (AMLACTIN) 12 % cream Apply topically as needed for dry skin.    Marland Kitchen aspirin 81 MG tablet Take 81 mg by mouth daily.    Marland Kitchen HYDROcodone-acetaminophen (NORCO) 10-325 MG per tablet Take 1 tablet by mouth every 6 (six) hours as needed for moderate pain.    . metFORMIN (GLUCOPHAGE) 500 MG tablet Take 500 mg by mouth 2 (two) times daily with a meal.    . morphine (MS  CONTIN) 60 MG 12 hr tablet Take 1 tablet (60 mg total) by mouth every 12 (twelve) hours. 60 tablet 0  . potassium chloride SA (K-DUR,KLOR-CON) 20 MEQ tablet Take 2 tablets (40 mEq total) by mouth 2 (two) times daily. 60 tablet 0   No current facility-administered medications on file prior to visit.    Past Surgical History  Procedure Laterality Date  . Pleurx catheter      right side    Denies any headaches, dizziness, double vision, fevers, chills, night sweats, nausea, vomiting, diarrhea, constipation, chest pain, heart palpitations, shortness of breath, blood in stool, black tarry stool, urinary pain, urinary burning, urinary frequency, hematuria.   PHYSICAL EXAMINATION  ECOG PERFORMANCE STATUS: 2 - Symptomatic, <50% confined to bed  Filed Vitals:   08/22/14 0914  BP: 98/63  Pulse: 116  Temp: 98.4 F (36.9 C)  Resp: 18    GENERAL:alert, no distress, cachectic and tired, accompanied by wife Ivin Booty. SKIN: skin color, texture, turgor are normal, no rashes or significant lesions HEAD: Normocephalic, No masses, lesions, tenderness or abnormalities EYES: normal, PERRLA, EOMI, Conjunctiva are pink and non-injected EARS: External ears normal OROPHARYNX:lips, buccal mucosa, and tongue normal and mucous membranes are moist  NECK: supple, trachea midline LYMPH:  not examined BREAST:not examined LUNGS: decreased breath sounds HEART: regular rate & rhythm ABDOMEN:abdomen soft and normal bowel sounds BACK: Back symmetric, no curvature.  EXTREMITIES:less then 2 second capillary refill, no skin discoloration  NEURO: alert & oriented x 3 with fluent speech, no focal motor/sensory deficits   LABORATORY DATA: CBC    Component Value Date/Time   WBC 14.4* 08/15/2014 1256   RBC 4.53 08/15/2014 1256   HGB 9.4* 08/15/2014 1256   HCT 31.9* 08/15/2014 1256   PLT 899* 08/15/2014 1256   MCV 70.4* 08/15/2014 1256   MCH 20.8* 08/15/2014 1256   MCHC 29.5* 08/15/2014 1256   RDW 22.5*  08/15/2014 1256   LYMPHSABS 2.2 08/15/2014 1256   MONOABS 1.9* 08/15/2014 1256   EOSABS 0.1 08/15/2014 1256   BASOSABS 0.0 08/15/2014 1256      Chemistry      Component Value Date/Time   NA 137 08/15/2014 1256   K 4.2 08/15/2014 1256   CL 99* 08/15/2014 1256   CO2 25 08/15/2014 1256   BUN 11 08/15/2014 1256   CREATININE 0.70 08/15/2014 1256      Component Value Date/Time   CALCIUM 9.5 08/15/2014 1256   ALKPHOS 100 08/15/2014 1256   AST 21 08/15/2014 1256   ALT 12* 08/15/2014 1256   BILITOT 0.6 08/15/2014 1256       RADIOGRAPHIC STUDIES:  Ct Abdomen Pelvis Wo Contrast  08/13/2014   CLINICAL DATA:  Initial encounter for right upper lobe lung cancer.  EXAM: CT ABDOMEN AND PELVIS WITHOUT CONTRAST  TECHNIQUE: Multidetector CT imaging of the abdomen and pelvis was performed following the standard protocol without IV contrast.  COMPARISON:  None.  FINDINGS: Lower chest: Right-sided chest to visualize with distal tip in the posterior right costophrenic sulcus. 12 mm left lower lobe pulmonary nodule has an adjacent smaller left lower lobe nodule. Small right pleural effusion evident. There is a small pericardial effusion.  Hepatobiliary: No focal abnormality in the liver on this study without intravenous contrast. No evidence for hepatomegaly. Gallbladder is contracted. No intrahepatic or extrahepatic biliary dilation.  Pancreas: No focal mass lesion. No dilatation of the main duct. No intraparenchymal cyst. No peripancreatic edema.  Spleen: No splenomegaly. No focal mass lesion.  Adrenals/Urinary Tract: No adrenal nodule or mass. No gross lesion evident within either kidney on this noncontrast study. No hydronephrosis. No evidence for hydroureter. Urinary bladder is unremarkable.  Stomach/Bowel: Stomach is nondistended. No gastric wall thickening. No evidence of outlet obstruction. Duodenum is normally positioned as is the ligament of Treitz. No small bowel wall thickening. No small bowel  dilatation. Terminal ileum not well seen. Appendix is normal. Large stool volume throughout the length of the colon.  Vascular/Lymphatic: There is abdominal aortic atherosclerosis without aneurysm. No lymphadenopathy in the abdomen or pelvis.  Reproductive: Prostate appears mildly enlarged. Seminal vesicles unremarkable.  Other: No intraperitoneal free fluid.  Musculoskeletal: Bone windows reveal no worrisome lytic or sclerotic osseous lesions.  IMPRESSION: 1. Right-sided chest tube with small right pleural effusion. 2. Left lower lobe pulmonary nodules measuring up to 12 mm. 3. Trace to small pericardial effusion. 4. No evidence for metastatic disease in the abdomen or pelvis on this study performed without intravenous contrast.   Electronically Signed   By: Misty Stanley M.D.   On: 08/13/2014 09:38   Dg Chest 1 View  08/04/2014   CLINICAL DATA:  Status post right apical lung biopsy.  EXAM: CHEST  1 VIEW  COMPARISON:  CT images from the procedure.  FINDINGS: Right apical and right hilar pulmonary mass lesions are noted. There is also severe emphysema. A PleurX drainage catheter is noted on the  right side. No postprocedural Norma thorax is demonstrated.  IMPRESSION: Status post biopsy without postprocedural pneumothorax.   Electronically Signed   By: Marijo Sanes M.D.   On: 08/04/2014 14:48   Nm Bone Scan Whole Body  08/12/2014   CLINICAL DATA:  Recent diagnosis of right upper lobe lung cancer (Pancoast tumor). Chronic low back pain for approximately 7 years. Staging.  EXAM: NUCLEAR MEDICINE WHOLE BODY BONE SCAN  TECHNIQUE: Whole body anterior and posterior images were obtained approximately 3 hours after intravenous injection of radiopharmaceutical.  RADIOPHARMACEUTICALS:  24.6 mCi Technetium-55mMDP IV  COMPARISON:  None.  FINDINGS: Increased activity involving the right anterior 1st rib. Increased activity involving the right maxilla. No abnormal activity to suggest metastatic disease elsewhere. Symmetric  activity involving the 1st MTP joints bilaterally, the acromioclavicular joints bilaterally, and the forefeet and mid feet bilaterally. Urinary tract activity related to physiologic excretion.  IMPRESSION: 1. Activity involving the right anterior 1st rib, likely secondary to tumor involvement. 2. No evidence of metastatic disease elsewhere. 3. Activity in the right side of the maxilla, more likely related to dental disease than metastatic disease. 4. Degenerative activity involving the 1st MTP joints, the acromioclavicular joints, the forefeet and the mid feet bilaterally.   Electronically Signed   By: TEvangeline DakinM.D.   On: 08/12/2014 15:24   Ct Biopsy  08/04/2014   CLINICAL DATA:  59year old male with symptomatic right apical Pancoast tumor. CT-guided biopsy is warranted for tissue diagnosis to initiate therapy.  EXAM: CT BIOPSY  Date: 08/04/2014  PROCEDURE: 1. CT-guided fine needle aspiration biopsy right apical mass 2. CT-guided core biopsy right apical mass Interventional Radiologist:  HCriselda Peaches MD  ANESTHESIA/SEDATION: Moderate (conscious) sedation was used. 2 mg Versed, 50 mcg Fentanyl were administered intravenously. The patient's vital signs were monitored continuously by radiology nursing throughout the procedure.  Sedation Time: 48 minutes  MEDICATIONS: None additional  TECHNIQUE: Informed consent was obtained from the patient following explanation of the procedure, risks, benefits and alternatives. The patient understands, agrees and consents for the procedure. All questions were addressed. A time out was performed.  A planning axial CT scan was performed. The right apical lung mass was successfully identified. A suitable skin entry site was selected and marked. The region was sterilely prepped and draped in standard fashion using Betadine skin prep.  Local anesthesia was attained by infiltration with 1% lidocaine. A small dermatotomy was made. Under real-time crash that under  intermittent CT fluoroscopic guidance, a 17 gauge introducer needle was advanced into the margin of the mass. Multiple 20 gauge fine needle aspirate biopsies were then obtained using Francine the needles.  Biopsy specimens demonstrated scant cellular material due to excessive hemorrhage. Therefore, multiple 18 gauge core biopsies were coaxially obtained using the bio Pince automated biopsy device. The introducer needle was removed. Post biopsy axial CT imaging demonstrates no evidence of pneumothorax or other acute complication. The patient tolerated the procedure well.  COMPLICATIONS: None  IMPRESSION: Technically successful CT-guided core biopsy right apical pulmonary mass.  Signed,  HCriselda Peaches MD  [facility]  [facility]   Electronically Signed   By: HJacqulynn CadetM.D.   On: 08/04/2014 13:35     PATHOLOGY:  Diagnosis Lung, needle/core biopsy(ies), right upper lobe apical mass - POSITIVE FOR POORLY DIFFERENTIATED CARCINOMA.    ASSESSMENT AND PLAN:  Lung cancer Malignancy of unknown primary right a right upper lobe mass and right pleural effusion, radiographically indicative of bronchogenic  primary.  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  Further testing is pending.  We will prepare to start chemotherapy next week with Carboplatin/Paclitaxel weekly.  This treatment plan was built.  Order is placed for a PICC line placement this order is signed and held. He will need chemotherapy teaching and a message is sent to our nurse navigator.  Nutritionist consult placed.  Return in 1 week for follow-up.   THERAPY PLAN:  Continue palliative radiation therapy.  All questions were answered. The patient knows to call the clinic with any problems, questions or concerns. We can certainly see the patient much sooner if necessary.  Patient and plan discussed with Dr. Ancil Linsey  and she is in agreement with the aforementioned.   This note is electronically signed by: Robynn Pane, PA-C 08/22/2014 10:06 AM

## 2014-08-22 NOTE — Assessment & Plan Note (Addendum)
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  Further testing is pending.  We will prepare to start chemotherapy next week with Carboplatin/Paclitaxel weekly.  This treatment plan was built.  Order is placed for a PICC line placement this order is signed and held. He will need chemotherapy teaching and a message is sent to our nurse navigator.  Nutritionist consult placed.  Return in 1 week for follow-up.

## 2014-08-25 MED ORDER — PROCHLORPERAZINE MALEATE 10 MG PO TABS: 10.0000 mg | ORAL_TABLET | Freq: Four times a day (QID) | ORAL | Status: DC | PRN

## 2014-08-25 MED ORDER — ONDANSETRON HCL 8 MG PO TABS: 8.0000 mg | ORAL_TABLET | Freq: Three times a day (TID) | ORAL | Status: DC | PRN

## 2014-08-25 NOTE — Patient Instructions (Addendum)
Bradley Molina   CHEMOTHERAPY INSTRUCTIONS  Premeds: Zofran - for nausea/vomiting prevention/reduction. Dexamethasone - steroid - given to reduce the risk of you having an allergic type reaction to the chemotherapy. Dex can cause you to feel energized, nervous/anxious/jittery, make you have trouble sleeping, and/or make you feel hot/flushed in the face/neck and/or look pink/red in the face/neck. These side effects will pass as the Dex wears off. Benadryl - anti-histamine - given to prevent an allergic type reaction to the Taxol chemo, Pepcid - anti-histamine - given to prevent an allergic type reaction to the Taxol chemo.  (premeds will take 1 hour to infuse)  Taxol - the first time you receive this drug we will titrate it very slowly to ensure that you do not have or are not having an allergic reaction to the chemo. Side Effects: hair loss, lowers your white blood cells (fight infection), muscle aches, nausea/vomiting, irritation to the mouth (mouth sores, pain in your mouth) *neuropathy - numbness/tingling/burning in hands/fingers/feet/toes. We need to know as soon as this begins to happen so that we can monitor it and treat if necessary. The numbness generally begins in the fingertips of tips of toes and then begins to travel up the finger/toe/hand/foot. We never want you getting to where you can't pick up a pen, coin, zip a zipper, button a button, or have trouble walking. You must tell us immediately if you are experiencing peripheral neuropathy! (the first time you receive this, it will take approximately 3 hours, after that it will only take 1 hour)  Carboplatin - this medication can be hard on your kidneys - this is why we need you to drink 64 oz of fluid or more (preferably water/decaff fluids) 2 days prior to chemo and for up to 4-5 days after chemo. Drink more if you can. This will help to keep your kidneys flushed. This can cause mild hair loss, lower your  platelets (which keep you from bleeding out when you cut yourself), lower your white blood cells (fight infection), and cause nausea/vomiting. (only takes 30 minutes to infuse)    POTENTIAL SIDE EFFECTS OF TREATMENT: Increased Susceptibility to Infection, Vomiting, Constipation, Hair Thinning, Changes in Character of Skin and Nails (brittleness, dryness,etc.), Bone Marrow Suppression, Abdominal Cramping, Complete Hair Loss, Nausea, Diarrhea, Sun Sensitivity and Mouth Sores   EDUCATIONAL MATERIALS GIVEN AND REVIEWED: Chemotherapy and You booklet Specific Instructions Sheets: Zofran, Dexamethasone, Benadryl, Pepcid, Taxol, Carboplatin, Zofran tablets, Compazine tablets   SELF CARE ACTIVITIES WHILE ON CHEMOTHERAPY: Increase your fluid intake 48 hours prior to treatment and drink at least 2 quarts per day after treatment., No alcohol intake., No aspirin or other medications unless approved by your oncologist., Eat foods that are light and easy to digest., Eat foods at cold or room temperature., No fried, fatty, or spicy foods immediately before or after treatment., Have teeth cleaned professionally before starting treatment. Keep dentures and partial plates clean., Use soft toothbrush and do not use mouthwashes that contain alcohol. Biotene is a good mouthwash that is available at most pharmacies or may be ordered by calling [phone]., Use warm salt water gargles (1 teaspoon salt per 1 quart warm water) before and after meals and at bedtime. Or you may rinse with 2 tablespoons of three -percent hydrogen peroxide mixed in eight ounces of water., Always use sunscreen with SPF (Sun Protection Factor) of 30 or higher., Use your nausea medication as directed to prevent nausea., Use your stool softener or laxative as  directed to prevent constipation. and Use your anti-diarrheal medication as directed to stop diarrhea.  Please wash your hands for at least 30 seconds using warm soapy water. Handwashing is  the #1 way to prevent the spread of germs. Stay away from sick people or people who are getting over a cold. If you develop respiratory systems such as green/yellow mucus production or productive cough or persistent cough let us know and we will see if you need an antibiotic. It is a good idea to keep a pair of gloves on when going into grocery stores/[employer] to decrease your risk of coming into contact with germs on the carts, etc. Carry alcohol hand gel with you at all times and use it frequently if out in public. All foods need to be cooked thoroughly. No raw foods. No medium or undercooked meats, eggs. If your food is cooked medium well, it does not need to be hot pink or saturated with bloody liquid at all. Vegetables and fruits need to be washed/rinsed under the faucet with a dish detergent before being consumed. You can eat raw fruits and vegetables unless we tell you otherwise but it would be best if you cooked them or bought frozen. Do not eat off of salad bars or hot bars unless you really trust the cleanliness of the restaurant. If you need dental work, please let Dr. Whitney Muse know before you go for your appointment so that we can coordinate the best possible time for you in regards to your chemo regimen. You need to also let your dentist know that you are actively taking chemo. We may need to do labs prior to your dental appointment. We also want your bowels moving at least every other day. If this is not happening, we need to know so that we can get you on a bowel regimen to help you go.      MEDICATIONS: You have been given prescriptions for the following medications:   Zofran '8mg'$  tablet. Take 1 tablet every 8 hours as needed for nausea/vomiting. (#1 nausea med to take, this can constipate)  Compazine '10mg'$  tablet. Take 1 tablet every 6 hours as needed for nausea/vomiting. (#2 nausea med to take, this can make you sleepy)   Over-the-Counter Meds:  Miralax 1 capful in 8 oz of fluid daily.  May increase to two times a day if needed. This is a stool softener. If this doesn't work proceed you can add:  Senokot S  - start with 1 tablet two times a day and increase to 4 tablets two times a day if needed. (total of 8 tablets in a 24 hour period). This is a stimulant laxative.   Call us if this does not help your bowels move.   Imodium '2mg'$  capsule. Take 2 capsules after the 1st loose stool and then 1 capsule every 2 hours until you go a total of 12 hours without having a loose stool. Call the [facility] if loose stools continue.   SYMPTOMS TO REPORT AS SOON AS POSSIBLE AFTER TREATMENT:  FEVER GREATER THAN 100.5 F  CHILLS WITH OR WITHOUT FEVER  NAUSEA AND VOMITING THAT IS NOT CONTROLLED WITH YOUR NAUSEA MEDICATION  UNUSUAL SHORTNESS OF BREATH  UNUSUAL BRUISING OR BLEEDING  TENDERNESS IN MOUTH AND THROAT WITH OR WITHOUT PRESENCE OF ULCERS  URINARY PROBLEMS  BOWEL PROBLEMS  UNUSUAL RASH    Wear comfortable clothing and clothing appropriate for easy access to any Portacath or PICC line. Let us know if there is anything  that we can do to make your therapy better!      I have been informed and understand all of the instructions given to me and have received a copy. I have been instructed to call the clinic [phone] or my family physician as soon as possible for continued medical care, if indicated. I do not have any more questions at this time but understand that I may call the [facility] or the Patient Navigator at [phone] during office hours should I have questions or need assistance in obtaining follow-up care.            Ondansetron injection What is this medicine? ONDANSETRON (on DAN se tron) is used to treat nausea and vomiting caused by chemotherapy. It is also used to prevent or treat nausea and vomiting after surgery. This medicine may be used for other purposes; ask your health care provider or pharmacist if you have questions. COMMON  BRAND NAME(S): Zofran What should I tell my health care provider before I take this medicine? They need to know if you have any of these conditions: -heart disease -history of irregular heartbeat -liver disease -low levels of magnesium or potassium in the blood -an unusual or allergic reaction to ondansetron, granisetron, other medicines, foods, dyes, or preservatives -pregnant or trying to get pregnant -breast-feeding How should I use this medicine? This medicine is for infusion into a vein. It is given by a health care professional in a hospital or clinic setting. Talk to your pediatrician regarding the use of this medicine in children. Special care may be needed. Overdosage: If you think you have taken too much of this medicine contact a poison control center or emergency room at once. NOTE: This medicine is only for you. Do not share this medicine with others. What if I miss a dose? This does not apply. What may interact with this medicine? Do not take this medicine with any of the following medications: -apomorphine -certain medicines for fungal infections like fluconazole, itraconazole, ketoconazole, posaconazole, voriconazole -cisapride -dofetilide -dronedarone -pimozide -thioridazine -ziprasidone This medicine may also interact with the following medications: -carbamazepine -certain medicines for depression, anxiety, or psychotic disturbances -fentanyl -linezolid -MAOIs like Carbex, Eldepryl, Marplan, Nardil, and Parnate -methylene blue (injected into a vein) -other medicines that prolong the QT interval (cause an abnormal heart rhythm) -phenytoin -rifampicin -tramadol This list may not describe all possible interactions. Give your health care provider a list of all the medicines, herbs, non-prescription drugs, or dietary supplements you use. Also tell them if you smoke, drink alcohol, or use illegal drugs. Some items may interact with your medicine. What should I watch  for while using this medicine? Your condition will be monitored carefully while you are receiving this medicine. What side effects may I notice from receiving this medicine? Side effects that you should report to your doctor or health care professional as soon as possible: -allergic reactions like skin rash, itching or hives, swelling of the face, lips, or tongue -breathing problems -confusion -dizziness -fast or irregular heartbeat -feeling faint or lightheaded, falls -fever and chills -loss of balance or coordination -seizures -sweating -swelling of the hands and feet -tightness in the chest -tremors -unusually weak or tired Side effects that usually do not require medical attention (report to your doctor or health care professional if they continue or are bothersome): -constipation or diarrhea -headache This list may not describe all possible side effects. Call your doctor for medical advice about side effects. You may report side effects  to FDA at 1-800-FDA-1088. Where should I keep my medicine? This drug is given in a hospital or clinic and will not be stored at home. NOTE: This sheet is a summary. It may not cover all possible information. If you have questions about this medicine, talk to your doctor, pharmacist, or health care provider.  2015, [employer]vier/Gold Standard. (2012-12-05 16:18:28) Dexamethasone injection What is this medicine? DEXAMETHASONE (dex a METH a sone) is a corticosteroid. It is used to treat inflammation of the skin, joints, lungs, and other organs. Common conditions treated include asthma, allergies, and arthritis. It is also used for other conditions, like blood disorders and diseases of the adrenal glands. This medicine may be used for other purposes; ask your health care provider or pharmacist if you have questions. COMMON BRAND NAME(S): Decadron, Solurex What should I tell my health care provider before I take this medicine? They need to know if you have  any of these conditions: -blood clotting problems -Cushing's syndrome -diabetes -glaucoma -heart problems or disease -high blood pressure -infection like herpes, measles, tuberculosis, or chickenpox -kidney disease -liver disease -mental problems -myasthenia gravis -osteoporosis -previous heart attack -seizures -stomach, ulcer or intestine disease including colitis and diverticulitis -thyroid problem -an unusual or allergic reaction to dexamethasone, corticosteroids, other medicines, lactose, foods, dyes, or preservatives -pregnant or trying to get pregnant -breast-feeding How should I use this medicine? This medicine is for injection into a muscle, joint, lesion, soft tissue, or vein. It is given by a health care professional in a hospital or clinic setting. Talk to your pediatrician regarding the use of this medicine in children. Special care may be needed. Overdosage: If you think you have taken too much of this medicine contact a poison control center or emergency room at once. NOTE: This medicine is only for you. Do not share this medicine with others. What if I miss a dose? This may not apply. If you are having a series of injections over a prolonged period, try not to miss an appointment. Call your doctor or health care professional to reschedule if you are unable to keep an appointment. What may interact with this medicine? Do not take this medicine with any of the following medications: -mifepristone, RU-486 -vaccines This medicine may also interact with the following medications: -amphotericin B -antibiotics like clarithromycin, erythromycin, and troleandomycin -aspirin and aspirin-like drugs -barbiturates like phenobarbital -carbamazepine -cholestyramine -cholinesterase inhibitors like donepezil, galantamine, rivastigmine, and tacrine -cyclosporine -digoxin -diuretics -ephedrine -male hormones, like estrogens or progestins and birth control  pills -indinavir -isoniazid -ketoconazole -medicines for diabetes -medicines that improve muscle tone or strength for conditions like myasthenia gravis -NSAIDs, medicines for pain and inflammation, like ibuprofen or naproxen -phenytoin -rifampin -thalidomide -warfarin This list may not describe all possible interactions. Give your health care provider a list of all the medicines, herbs, non-prescription drugs, or dietary supplements you use. Also tell them if you smoke, drink alcohol, or use illegal drugs. Some items may interact with your medicine. What should I watch for while using this medicine? Your condition will be monitored carefully while you are receiving this medicine. If you are taking this medicine for a long time, carry an identification card with your name and address, the type and dose of your medicine, and your doctor's name and address. This medicine may increase your risk of getting an infection. Stay away from people who are sick. Tell your doctor or health care professional if you are around anyone with measles or chickenpox. Talk to your health care  provider before you get any vaccines that you take this medicine. If you are going to have surgery, tell your doctor or health care professional that you have taken this medicine within the last twelve months. Ask your doctor or health care professional about your diet. You may need to lower the amount of salt you eat. The medicine can increase your blood sugar. If you are a diabetic check with your doctor if you need help adjusting the dose of your diabetic medicine. What side effects may I notice from receiving this medicine? Side effects that you should report to your doctor or health care professional as soon as possible: -allergic reactions like skin rash, itching or hives, swelling of the face, lips, or tongue -black or tarry stools -change in the amount of urine -changes in vision -confusion, excitement, restlessness,  a false sense of well-being -fever, sore throat, sneezing, cough, or other signs of infection, wounds that will not heal -hallucinations -increased thirst -mental depression, mood swings, mistaken feelings of self importance or of being mistreated -pain in hips, back, ribs, arms, shoulders, or legs -pain, redness, or irritation at the injection site -redness, blistering, peeling or loosening of the skin, including inside the mouth -rounding out of face -swelling of feet or lower legs -unusual bleeding or bruising -unusual tired or weak -wounds that do not heal Side effects that usually do not require medical attention (report to your doctor or health care professional if they continue or are bothersome): -diarrhea or constipation -change in taste -headache -nausea, vomiting -skin problems, acne, thin and shiny skin -touble sleeping -unusual growth of hair on the face or body -weight gain This list may not describe all possible side effects. Call your doctor for medical advice about side effects. You may report side effects to FDA at 1-800-FDA-1088. Where should I keep my medicine? This drug is given in a hospital or clinic and will not be stored at home. NOTE: This sheet is a summary. It may not cover all possible information. If you have questions about this medicine, talk to your doctor, pharmacist, or health care provider.  2015, [employer]/Gold Standard. (2007-06-21 14:04:12) Diphenhydramine injection What is this medicine? DIPHENHYDRAMINE (dye fen HYE dra meen) is an antihistamine. It is used to treat the symptoms of an allergic reaction and motion sickness. It is also used to treat Parkinson's disease. This medicine may be used for other purposes; ask your health care provider or pharmacist if you have questions. COMMON BRAND NAME(S): Benadryl What should I tell my health care provider before I take this medicine? They need to know if you have any of these conditions: -asthma  or lung disease -glaucoma -high blood pressure or heart disease -liver disease -pain or difficulty passing urine -prostate trouble -ulcers or other stomach problems -an unusual or allergic reaction to diphenhydramine, antihistamines, other medicines foods, dyes, or preservatives -pregnant or trying to get pregnant -breast-feeding How should I use this medicine? This medicine is for injection into a vein or a muscle. It is usually given by a health care professional in a hospital or clinic setting. If you get this medicine at home, you will be taught how to prepare and give this medicine. Use exactly as directed. Take your medicine at regular intervals. Do not take your medicine more often than directed. It is important that you put your used needles and syringes in a special sharps container. Do not put them in a trash can. If you do not have a sharps container, call  your pharmacist or healthcare provider to get one. Talk to your pediatrician regarding the use of this medicine in children. While this drug may be prescribed for selected conditions, precautions do apply. This medicine is not approved for use in newborns and premature babies. Patients over 12 years old may have a stronger reaction and need a smaller dose. Overdosage: If you think you have taken too much of this medicine contact a poison control center or emergency room at once. NOTE: This medicine is only for you. Do not share this medicine with others. What if I miss a dose? If you miss a dose, take it as soon as you can. If it is almost time for your next dose, take only that dose. Do not take double or extra doses. What may interact with this medicine? Do not take this medicine with any of the following medications: -MAOIs like Carbex, Eldepryl, Marplan, Nardil, and Parnate This medicine may also interact with the following medications: -alcohol -barbiturates, like phenobarbital -medicines for bladder spasm like oxybutynin,  tolterodine -medicines for blood pressure -medicines for depression, anxiety, or psychotic disturbances -medicines for movement abnormalities or Parkinson's disease -medicines for sleep -other medicines for cold, cough or allergy -some medicines for the stomach like chlordiazepoxide, dicyclomine This list may not describe all possible interactions. Give your health care provider a list of all the medicines, herbs, non-prescription drugs, or dietary supplements you use. Also tell them if you smoke, drink alcohol, or use illegal drugs. Some items may interact with your medicine. What should I watch for while using this medicine? Your condition will be monitored carefully while you are receiving this medicine. Tell your doctor or healthcare professional if your symptoms do not start to get better or if they get worse. You may get drowsy or dizzy. Do not drive, use machinery, or do anything that needs mental alertness until you know how this medicine affects you. Do not stand or sit up quickly, especially if you are an older patient. This reduces the risk of dizzy or fainting spells. Alcohol may interfere with the effect of this medicine. Avoid alcoholic drinks. Your mouth may get dry. Chewing sugarless gum or sucking hard candy, and drinking plenty of water may help. Contact your doctor if the problem does not go away or is severe. What side effects may I notice from receiving this medicine? Side effects that you should report to your doctor or health care professional as soon as possible: -allergic reactions like skin rash, itching or hives, swelling of the face, lips, or tongue -breathing problems -changes in vision -chills -confused, agitated, nervous -irregular or fast heartbeat -low blood pressure -seizures -tremor -trouble passing urine -unusual bleeding or bruising -unusually weak or tired Side effects that usually do not require medical attention (report to your doctor or health care  professional if they continue or are bothersome): -constipation, diarrhea -drowsy -headache -loss of appetite -stomach upset, vomiting -sweating -thick mucous This list may not describe all possible side effects. Call your doctor for medical advice about side effects. You may report side effects to FDA at 1-800-FDA-1088. Where should I keep my medicine? Keep out of the reach of children. If you are using this medicine at home, you will be instructed on how to store this medicine. Throw away any unused medicine after the expiration date on the label. NOTE: This sheet is a summary. It may not cover all possible information. If you have questions about this medicine, talk to your doctor, pharmacist, or health  care provider.  2015, [employer]/Gold Standard. (2007-06-19 14:28:35) Famotidine injection What is this medicine? FAMOTIDINE (fa MOE ti deen) is a type of antihistamine that blocks the release of stomach acid. It is used to treat stomach or intestinal ulcers. It can relieve ulcer pain and discomfort, and the heartburn from acid reflux. This medicine may be used for other purposes; ask your health care provider or pharmacist if you have questions. COMMON BRAND NAME(S): Pepcid What should I tell my health care provider before I take this medicine? They need to know if you have any of these conditions: -kidney or liver disease -an unusual or allergic reaction to famotidine, other medicines, foods, dyes, or preservatives -pregnant or trying to get pregnant -breast-feeding How should I use this medicine? This medicine is for infusion into a vein. It is given by a health care professional in a hospital or clinic setting. Talk to your pediatrician regarding the use of this medicine in children. Special care may be needed. Overdosage: If you think you have taken too much of this medicine contact a poison control center or emergency room at once. NOTE: This medicine is only for you. Do not share  this medicine with others. What if I miss a dose? This does not apply. What may interact with this medicine? -delavirdine -itraconazole -ketoconazole This list may not describe all possible interactions. Give your health care provider a list of all the medicines, herbs, non-prescription drugs, or dietary supplements you use. Also tell them if you smoke, drink alcohol, or use illegal drugs. Some items may interact with your medicine. What should I watch for while using this medicine? Tell your doctor or health care professional if your condition does not start to get better or gets worse. Do not take with aspirin, ibuprofen, or other antiinflammatory medicines. These can aggravate your condition. Do not smoke cigarettes or drink alcohol. These increase irritation in your stomach and can increase the time it will take for ulcers to heal. Cigarettes and alcohol can also worsen acid reflux or heartburn. If you get black, tarry stools or vomit up what looks like coffee grounds, call your doctor or health care professional at once. You may have a bleeding ulcer. What side effects may I notice from receiving this medicine? Side effects that you should report to your doctor or health care professional as soon as possible: -allergic reactions like skin rash, itching or hives, swelling of the face, lips, or tongue -agitation, nervousness -confusion -hallucinations Side effects that usually do not require medical attention (report to your doctor or health care professional if they continue or are bothersome): -constipation -diarrhea -dizziness -headache This list may not describe all possible side effects. Call your doctor for medical advice about side effects. You may report side effects to FDA at 1-800-FDA-1088. Where should I keep my medicine? This medicine is given in a hospital or clinic. You will not be given this medicine to store at home. NOTE: This sheet is a summary. It may not cover all  possible information. If you have questions about this medicine, talk to your doctor, pharmacist, or health care provider.  2015, Elsevier/Gold Standard. (2007-07-04 13:24:51) Paclitaxel injection What is this medicine? PACLITAXEL (PAK li TAX el) is a chemotherapy drug. It targets fast dividing cells, like cancer cells, and causes these cells to die. This medicine is used to treat ovarian cancer, breast cancer, and other cancers. This medicine may be used for other purposes; ask your health care provider or pharmacist if you have  questions. COMMON BRAND NAME(S): Onxol, Taxol What should I tell my health care provider before I take this medicine? They need to know if you have any of these conditions: -blood disorders -irregular heartbeat -infection (especially a virus infection such as chickenpox, cold sores, or herpes) -liver disease -previous or ongoing radiation therapy -an unusual or allergic reaction to paclitaxel, alcohol, polyoxyethylated castor oil, other chemotherapy agents, other medicines, foods, dyes, or preservatives -pregnant or trying to get pregnant -breast-feeding How should I use this medicine? This drug is given as an infusion into a vein. It is administered in a hospital or clinic by a specially trained health care professional. Talk to your pediatrician regarding the use of this medicine in children. Special care may be needed. Overdosage: If you think you have taken too much of this medicine contact a poison control center or emergency room at once. NOTE: This medicine is only for you. Do not share this medicine with others. What if I miss a dose? It is important not to miss your dose. Call your doctor or health care professional if you are unable to keep an appointment. What may interact with this medicine? Do not take this medicine with any of the following medications: -disulfiram -metronidazole This medicine may also interact with the following  medications: -cyclosporine -diazepam -ketoconazole -medicines to increase blood counts like filgrastim, pegfilgrastim, sargramostim -other chemotherapy drugs like cisplatin, doxorubicin, epirubicin, etoposide, teniposide, vincristine -quinidine -testosterone -vaccines -verapamil Talk to your doctor or health care professional before taking any of these medicines: -acetaminophen -aspirin -ibuprofen -ketoprofen -naproxen This list may not describe all possible interactions. Give your health care provider a list of all the medicines, herbs, non-prescription drugs, or dietary supplements you use. Also tell them if you smoke, drink alcohol, or use illegal drugs. Some items may interact with your medicine. What should I watch for while using this medicine? Your condition will be monitored carefully while you are receiving this medicine. You will need important blood work done while you are taking this medicine. This drug may make you feel generally unwell. This is not uncommon, as chemotherapy can affect healthy cells as well as cancer cells. Report any side effects. Continue your course of treatment even though you feel ill unless your doctor tells you to stop. In some cases, you may be given additional medicines to help with side effects. Follow all directions for their use. Call your doctor or health care professional for advice if you get a fever, chills or sore throat, or other symptoms of a cold or flu. Do not treat yourself. This drug decreases your body's ability to fight infections. Try to avoid being around people who are sick. This medicine may increase your risk to bruise or bleed. Call your doctor or health care professional if you notice any unusual bleeding. Be careful brushing and flossing your teeth or using a toothpick because you may get an infection or bleed more easily. If you have any dental work done, tell your dentist you are receiving this medicine. Avoid taking products  that contain aspirin, acetaminophen, ibuprofen, naproxen, or ketoprofen unless instructed by your doctor. These medicines may hide a fever. Do not become pregnant while taking this medicine. Women should inform their doctor if they wish to become pregnant or think they might be pregnant. There is a potential for serious side effects to an unborn child. Talk to your health care professional or pharmacist for more information. Do not breast-feed an infant while taking this medicine. Men are  advised not to father a child while receiving this medicine. What side effects may I notice from receiving this medicine? Side effects that you should report to your doctor or health care professional as soon as possible: -allergic reactions like skin rash, itching or hives, swelling of the face, lips, or tongue -low blood counts - This drug may decrease the number of white blood cells, red blood cells and platelets. You may be at increased risk for infections and bleeding. -signs of infection - fever or chills, cough, sore throat, pain or difficulty passing urine -signs of decreased platelets or bleeding - bruising, pinpoint red spots on the skin, black, tarry stools, nosebleeds -signs of decreased red blood cells - unusually weak or tired, fainting spells, lightheadedness -breathing problems -chest pain -high or low blood pressure -mouth sores -nausea and vomiting -pain, swelling, redness or irritation at the injection site -pain, tingling, numbness in the hands or feet -slow or irregular heartbeat -swelling of the ankle, feet, hands Side effects that usually do not require medical attention (report to your doctor or health care professional if they continue or are bothersome): -bone pain -complete hair loss including hair on your head, underarms, pubic hair, eyebrows, and eyelashes -changes in the color of fingernails -diarrhea -loosening of the fingernails -loss of appetite -muscle or joint pain -red  flush to skin -sweating This list may not describe all possible side effects. Call your doctor for medical advice about side effects. You may report side effects to FDA at 1-800-FDA-1088. Where should I keep my medicine? This drug is given in a hospital or clinic and will not be stored at home. NOTE: This sheet is a summary. It may not cover all possible information. If you have questions about this medicine, talk to your doctor, pharmacist, or health care provider.  2015, Elsevier/Gold Standard. (2012-04-23 16:41:21) Carboplatin injection What is this medicine? CARBOPLATIN (KAR boe pla tin) is a chemotherapy drug. It targets fast dividing cells, like cancer cells, and causes these cells to die. This medicine is used to treat ovarian cancer and many other cancers. This medicine may be used for other purposes; ask your health care provider or pharmacist if you have questions. COMMON BRAND NAME(S): Paraplatin What should I tell my health care provider before I take this medicine? They need to know if you have any of these conditions: -blood disorders -hearing problems -kidney disease -recent or ongoing radiation therapy -an unusual or allergic reaction to carboplatin, cisplatin, other chemotherapy, other medicines, foods, dyes, or preservatives -pregnant or trying to get pregnant -breast-feeding How should I use this medicine? This drug is usually given as an infusion into a vein. It is administered in a hospital or clinic by a specially trained health care professional. Talk to your pediatrician regarding the use of this medicine in children. Special care may be needed. Overdosage: If you think you have taken too much of this medicine contact a poison control center or emergency room at once. NOTE: This medicine is only for you. Do not share this medicine with others. What if I miss a dose? It is important not to miss a dose. Call your doctor or health care professional if you are unable  to keep an appointment. What may interact with this medicine? -medicines for seizures -medicines to increase blood counts like filgrastim, pegfilgrastim, sargramostim -some antibiotics like amikacin, gentamicin, neomycin, streptomycin, tobramycin -vaccines Talk to your doctor or health care professional before taking any of these medicines: -acetaminophen -aspirin -ibuprofen -ketoprofen -naproxen This  list may not describe all possible interactions. Give your health care provider a list of all the medicines, herbs, non-prescription drugs, or dietary supplements you use. Also tell them if you smoke, drink alcohol, or use illegal drugs. Some items may interact with your medicine. What should I watch for while using this medicine? Your condition will be monitored carefully while you are receiving this medicine. You will need important blood work done while you are taking this medicine. This drug may make you feel generally unwell. This is not uncommon, as chemotherapy can affect healthy cells as well as cancer cells. Report any side effects. Continue your course of treatment even though you feel ill unless your doctor tells you to stop. In some cases, you may be given additional medicines to help with side effects. Follow all directions for their use. Call your doctor or health care professional for advice if you get a fever, chills or sore throat, or other symptoms of a cold or flu. Do not treat yourself. This drug decreases your body's ability to fight infections. Try to avoid being around people who are sick. This medicine may increase your risk to bruise or bleed. Call your doctor or health care professional if you notice any unusual bleeding. Be careful brushing and flossing your teeth or using a toothpick because you may get an infection or bleed more easily. If you have any dental work done, tell your dentist you are receiving this medicine. Avoid taking products that contain aspirin,  acetaminophen, ibuprofen, naproxen, or ketoprofen unless instructed by your doctor. These medicines may hide a fever. Do not become pregnant while taking this medicine. Women should inform their doctor if they wish to become pregnant or think they might be pregnant. There is a potential for serious side effects to an unborn child. Talk to your health care professional or pharmacist for more information. Do not breast-feed an infant while taking this medicine. What side effects may I notice from receiving this medicine? Side effects that you should report to your doctor or health care professional as soon as possible: -allergic reactions like skin rash, itching or hives, swelling of the face, lips, or tongue -signs of infection - fever or chills, cough, sore throat, pain or difficulty passing urine -signs of decreased platelets or bleeding - bruising, pinpoint red spots on the skin, black, tarry stools, nosebleeds -signs of decreased red blood cells - unusually weak or tired, fainting spells, lightheadedness -breathing problems -changes in hearing -changes in vision -chest pain -high blood pressure -low blood counts - This drug may decrease the number of white blood cells, red blood cells and platelets. You may be at increased risk for infections and bleeding. -nausea and vomiting -pain, swelling, redness or irritation at the injection site -pain, tingling, numbness in the hands or feet -problems with balance, talking, walking -trouble passing urine or change in the amount of urine Side effects that usually do not require medical attention (report to your doctor or health care professional if they continue or are bothersome): -hair loss -loss of appetite -metallic taste in the mouth or changes in taste This list may not describe all possible side effects. Call your doctor for medical advice about side effects. You may report side effects to FDA at 1-800-FDA-1088. Where should I keep my  medicine? This drug is given in a hospital or clinic and will not be stored at home. NOTE: This sheet is a summary. It may not cover all possible information. If you have questions  about this medicine, talk to your doctor, pharmacist, or health care provider.  2015, Elsevier/Gold Standard. (2007-06-05 14:38:05)

## 2014-08-27 ENCOUNTER — Encounter (HOSPITAL_BASED_OUTPATIENT_CLINIC_OR_DEPARTMENT_OTHER): Payer: Medicare Other

## 2014-08-27 ENCOUNTER — Ambulatory Visit (HOSPITAL_COMMUNITY)
Admission: RE | Admit: 2014-08-27 | Discharge: 2014-08-27 | Disposition: A | Discharge status: Home / Self Care | Payer: Medicare Other | Source: Ambulatory Visit | Attending: Oncology | Admitting: Oncology

## 2014-08-27 DIAGNOSIS — C3411 Malignant neoplasm of upper lobe, right bronchus or lung: Secondary | ICD-10-CM

## 2014-08-27 DIAGNOSIS — Z452 Encounter for adjustment and management of vascular access device: Secondary | ICD-10-CM | POA: Insufficient documentation

## 2014-08-27 DIAGNOSIS — C7801 Secondary malignant neoplasm of right lung: Secondary | ICD-10-CM | POA: Diagnosis not present

## 2014-08-27 DIAGNOSIS — C801 Malignant (primary) neoplasm, unspecified: Secondary | ICD-10-CM

## 2014-08-27 MED ORDER — SODIUM CHLORIDE 0.9 % IJ SOLN: 10.0000 mL | Freq: Two times a day (BID) | INTRAMUSCULAR | Status: DC

## 2014-08-27 MED ORDER — SODIUM CHLORIDE 0.9 % IJ SOLN: 10.0000 mL | INTRAMUSCULAR | Status: DC | PRN

## 2014-08-27 NOTE — Progress Notes (Signed)
Chemo teaching done and consent signed for Carboplatin & Taxol. Distress screening done. Chemo calendar given to patient.

## 2014-08-27 NOTE — Discharge Instructions (Signed)
PICC Home Guide A peripherally inserted central catheter (PICC) is a long, thin, flexible tube that is inserted into a vein in the upper arm. It is a form of intravenous (IV) access. It is considered to be a "central" line because the tip of the PICC ends in a large vein in your chest. This large vein is called the superior vena cava (SVC). The PICC tip ends in the SVC because there is a lot of blood flow in the SVC. This allows medicines and IV fluids to be quickly distributed throughout the body. The PICC is inserted using a sterile technique by a specially trained nurse or physician. After the PICC is inserted, a chest X-ray exam is done to be sure it is in the correct place.  A PICC may be placed for different reasons, such as:  To give medicines and liquid nutrition that can only be given through a central line. Examples are:  Certain antibiotic treatments.  Chemotherapy.  Total parenteral nutrition (TPN).  To take frequent blood samples.  To give IV fluids and blood products.  If there is difficulty placing a peripheral intravenous (PIV) catheter. If taken care of properly, a PICC can remain in place for several months. A PICC can also allow a person to go home from the hospital early. Medicine and PICC care can be managed at home by a family member or home health care team. WHAT PROBLEMS CAN HAPPEN WHEN I HAVE A PICC? Problems with a PICC can occasionally occur. These may include the following:  A blood clot (thrombus) forming in or at the tip of the PICC. This can cause the PICC to become clogged. A clot-dissolving medicine called tissue plasminogen activator (tPA) can be given through the PICC to help break up the clot.  Inflammation of the vein (phlebitis) in which the PICC is placed. Signs of inflammation may include redness, pain at the insertion site, red streaks, or being able to feel a "cord" in the vein where the PICC is located.  Infection in the PICC or at the insertion  site. Signs of infection may include fever, chills, redness, swelling, or pus drainage from the PICC insertion site.  PICC movement (malposition). The PICC tip may move from its original position due to excessive physical activity, forceful coughing, sneezing, or vomiting.  A break or cut in the PICC. It is important to not use scissors near the PICC.  Nerve or tendon irritation or injury during PICC insertion. WHAT SHOULD I KEEP IN MIND ABOUT ACTIVITIES WHEN I HAVE A PICC?  You may bend your arm and move it freely. If your PICC is near or at the bend of your elbow, avoid activity with repeated motion at the elbow.  Rest at home for the remainder of the day following PICC line insertion.  Avoid lifting heavy objects as instructed by your health care provider.  Avoid using a crutch with the arm on the same side as your PICC. You may need to use a walker. WHAT SHOULD I KNOW ABOUT MY PICC DRESSING?  Keep your PICC bandage (dressing) clean and dry to prevent infection.  Ask your health care provider when you may shower. Ask your health care provider to teach you how to wrap the PICC when you do take a shower.  Change the PICC dressing as instructed by your health care provider.  Change your PICC dressing if it becomes loose or wet. WHAT SHOULD I KNOW ABOUT PICC CARE?  Check the PICC insertion site   daily for leakage, redness, swelling, or pain.  Do not take a bath, swim, or use hot tubs when you have a PICC. Cover PICC line with clear plastic wrap and tape to keep it dry while showering.  Flush the PICC as directed by your health care provider. Let your health care provider know right away if the PICC is difficult to flush or does not flush. Do not use force to flush the PICC.  Do not use a syringe that is less than 10 mL to flush the PICC.  Never pull or tug on the PICC.  Avoid blood pressure checks on the arm with the PICC.  Keep your PICC identification card with you at all  times.  Do not take the PICC out yourself. Only a trained clinical professional should remove the PICC. SEEK IMMEDIATE MEDICAL CARE IF:  Your PICC is accidentally pulled all the way out. If this happens, cover the insertion site with a bandage or gauze dressing. Do not throw the PICC away. Your health care provider will need to inspect it.  Your PICC was tugged or pulled and has partially come out. Do not  push the PICC back in.  There is any type of drainage, redness, or swelling where the PICC enters the skin.  You cannot flush the PICC, it is difficult to flush, or the PICC leaks around the insertion site when it is flushed.  You hear a "flushing" sound when the PICC is flushed.  You have pain, discomfort, or numbness in your arm, shoulder, or jaw on the same side as the PICC.  You feel your heart "racing" or skipping beats.  You notice a hole or tear in the PICC.  You develop chills or a fever. MAKE SURE YOU:   Understand these instructions.  Will watch your condition.  Will get help right away if you are not doing well or get worse. Document Released: 09/04/2002 Document Revised: 07/15/2013 Document Reviewed: 11/05/2012 ExitCare Patient Information 2015 [employer]. This information is not intended to replace advice given to you by your health care provider. Make sure you discuss any questions you have with your health care provider.  

## 2014-08-27 NOTE — Progress Notes (Signed)
Peripherally Inserted Central Catheter/Midline Placement  The IV Nurse has discussed with the patient and/or persons authorized to consent for the patient, the purpose of this procedure and the potential benefits and risks involved with this procedure.  The benefits include less needle sticks, lab draws from the catheter and patient may be discharged home with the catheter.  Risks include, but not limited to, infection, bleeding, blood clot (thrombus formation), and puncture of an artery; nerve damage and irregular heat beat.  Alternatives to this procedure were also discussed.  PICC/Midline Placement Documentation  PICC / Midline Single Lumen 09/38/18 PICC Right Basilic 40 cm 0 cm (Active)  Indication for Insertion or Continuance of Line Administration of hyperosmolar/irritating solutions (i.e. TPN, Vancomycin, etc.) 08/27/2014  9:30 AM  Exposed Catheter (cm) 0 cm 08/27/2014  9:30 AM  Site Assessment Clean;Dry;Intact 08/27/2014  9:30 AM  Line Status Flushed;Saline locked;Capped (central line);Blood return noted 08/27/2014  9:30 AM  Dressing Type Transparent 08/27/2014  9:30 AM  Dressing Status Clean;Dry;Intact 08/27/2014  9:30 AM    Ok to use per ECG technology   Roselind Messier 08/27/2014, 9:41 AM

## 2014-08-28 ENCOUNTER — Other Ambulatory Visit (HOSPITAL_COMMUNITY): Payer: Self-pay | Admitting: *Deleted

## 2014-08-28 ENCOUNTER — Encounter (HOSPITAL_COMMUNITY): Payer: Self-pay

## 2014-08-28 DIAGNOSIS — C3411 Malignant neoplasm of upper lobe, right bronchus or lung: Secondary | ICD-10-CM

## 2014-08-28 MED ORDER — CRIZOTINIB 250 MG PO CAPS: 250.0000 mg | ORAL_CAPSULE | Freq: Two times a day (BID) | ORAL | Status: DC

## 2014-08-29 ENCOUNTER — Encounter (HOSPITAL_BASED_OUTPATIENT_CLINIC_OR_DEPARTMENT_OTHER): Payer: Medicare Other

## 2014-08-29 ENCOUNTER — Ambulatory Visit (HOSPITAL_COMMUNITY): Payer: Medicare Other | Admitting: Hematology & Oncology

## 2014-08-29 ENCOUNTER — Encounter (HOSPITAL_COMMUNITY): Payer: Self-pay

## 2014-08-29 ENCOUNTER — Encounter: Payer: Self-pay | Admitting: Dietician

## 2014-08-29 ENCOUNTER — Inpatient Hospital Stay (HOSPITAL_COMMUNITY): Payer: Medicare Other

## 2014-08-29 VITALS — BP 111/81 | HR 110 | Temp 98.2°F | Resp 18

## 2014-08-29 DIAGNOSIS — Z452 Encounter for adjustment and management of vascular access device: Secondary | ICD-10-CM | POA: Diagnosis not present

## 2014-08-29 DIAGNOSIS — C801 Malignant (primary) neoplasm, unspecified: Secondary | ICD-10-CM | POA: Diagnosis not present

## 2014-08-29 DIAGNOSIS — C3411 Malignant neoplasm of upper lobe, right bronchus or lung: Secondary | ICD-10-CM

## 2014-08-29 DIAGNOSIS — C7801 Secondary malignant neoplasm of right lung: Secondary | ICD-10-CM | POA: Diagnosis not present

## 2014-08-29 MED ORDER — HEPARIN SOD (PORK) LOCK FLUSH 100 UNIT/ML IV SOLN
250.0000 [IU] | Freq: Once | INTRAVENOUS | Status: AC
  Administered 2014-08-29: 250 [IU] via INTRAVENOUS
  Filled 2014-08-29: qty 5

## 2014-08-29 MED ORDER — SODIUM CHLORIDE 0.9 % IJ SOLN
10.0000 mL | INTRAMUSCULAR | Status: DC | PRN
  Administered 2014-08-29: 10 mL via INTRAVENOUS
  Filled 2014-08-29: qty 10

## 2014-08-29 MED ORDER — MAGIC MOUTHWASH W/LIDOCAINE: ORAL | Status: DC

## 2014-08-29 MED ORDER — MAGIC MOUTHWASH W/LIDOCAINE: ORAL | Status: AC

## 2014-08-29 NOTE — Progress Notes (Signed)
Jonathon Biondo presented for PICC line flush.  See IV assessment in docflowsheets for PICC details.  Proper placement of PICC confirmed by CXR.  PICC located right arm.  Good blood return present. PICC flushed with 23m NS and 250U Heparin, see MAR for further details.  Jaziel Coil tolerated procedure well and without incident.

## 2014-08-29 NOTE — Progress Notes (Signed)
Nutrition Consult placed for pt.   Contacted Pt by Phone  Wt Readings from Last 10 Encounters:  08/22/14 158 lb 9.6 oz (71.94 kg)  08/15/14 160 lb (72.576 kg)  08/13/14 163 lb (73.936 kg)  08/08/14 163 lb 8 oz (74.163 kg)  08/04/14 163 lb (73.936 kg)  07/31/14 163 lb 6.4 oz (74.118 kg)   Patient weight has fallen by ~5 pounds since I spoke with him ~3 weeks ago.   Spoke with pt's wife. Wife reports patient has been eating well until just the past couple days. He has been eating 3 meals a day with some snacks.   Discussed with wife the importance of eating higher calorie/protein foods as his appetite is slowing down. Per report, pt does eat vegetables. Educated wife that vegetables are high in fiber and low in protein. She should add a cream/cheese sauce to the vegetables and make sure he eats his protein first at each meal.   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 Reinforced that she can contact me to pick up another case whenever he runs out.  Wife state pt has had no n/v/d/c. He has finished his radiation and has had some taste changes. The family have a friend who went through the same treatment and recomended using plastic utensils and serving ware. This has helped pt manage side effects.   Will continue to monitor weight as his chemo begins.   Mailed my contact info, coupons, and handouts titled "Making the most of each bite"   Burtis Junes RD, LDN Nutrition Pager: [phone] 08/29/2014 11:56 AM

## 2014-08-29 NOTE — Patient Instructions (Signed)
[place] at [facility] Discharge Instructions  RECOMMENDATIONS MADE BY THE CONSULTANT AND ANY TEST RESULTS WILL BE SENT TO YOUR REFERRING PHYSICIAN.  PICC line flush Follow up as scheduled  Thank you for choosing [place] at [facility] to provide your oncology and hematology care.  To afford each patient quality time with our provider, please arrive at least 15 minutes before your scheduled appointment time.    You need to re-schedule your appointment should you arrive 10 or more minutes late.  We strive to give you quality time with our providers, and arriving late affects you and other patients whose appointments are after yours.  Also, if you no show three or more times for appointments you may be dismissed from the clinic at the providers discretion.     Again, thank you for choosing [facility].  Our hope is that these requests will decrease the amount of time that you wait before being seen by our physicians.       _____________________________________________________________  Should you have questions after your visit to [facility], please contact our office at [phone] between the hours of 8:30 a.m. and 4:30 p.m.  Voicemails left after 4:30 p.m. will not be returned until the following business day.  For prescription refill requests, have your pharmacy contact our office.

## 2014-08-29 NOTE — Progress Notes (Signed)
[employer] called in to [facility] in [place].

## 2014-09-01 NOTE — Patient Instructions (Addendum)
[place]   CHEMOTHERAPY INSTRUCTIONS  Hulda Humphrey-   The most common side effects of XALKORI include: vision problems  nausea  diarrhea  vomiting  swelling of your hands, feet, face, and eyes  constipation  increased liver function blood test results  tiredness  decreased appetite  upper respiratory infection  dizziness  feeling of numbness or tingling in the extremities  XALKORI can cause changes in your vision, dizziness, and tiredness. If you have these symptoms avoid driving a car, using machinery, or doing anything that needs you to be alert.  Hulda Humphrey may cause decreased fertility. In females, this could affect your ability to become pregnant. In males, this could affect your ability to father a child.  Swallow XALKORI capsules whole. You may take XALKORI with or without food. Your healthcare provider may change your dose, temporarily stop, or permanently stop treatment with Hulda Humphrey if you have certain side effects. Do not change your dose or stop taking Hulda Humphrey unless your healthcare provider tells you. If you miss a dose, take it as soon as you remember. If it is close to your next dose (within 6 hours), just take your next dose at your regular time. If you vomit after taking a dose of XALKORI, do not take an extra dose, just take your next dose at your regular time.  You should NOT drink grapefruit juice or eat grapefruit during your treatment with XALKORI. It may increase the amount of XALKORI in your blood to a harmful level.   What is the most important information I should know about Hulda Humphrey?  Hulda Humphrey may cause serious side effects, including:  Liver problems. Hulda Humphrey may cause life-threatening liver injury that may lead to death. Your healthcare provider should do blood tests at least every month to check your liver during treatment with XALKORI. Tell your healthcare provider right away if you get any of the following new or worsening  symptoms: yellowing of your skin or the white part of your eyes  severe tiredness  dark or brown (tea color) urine  nausea or vomiting decreased appetite  pain on the right side of your stomach  bleed or bruise more easily than normal  itching Lung problems (pneumonitis). Hulda Humphrey may cause life-threatening lung problems that may lead to death. Symptoms may be similar to those symptoms from lung cancer. Tell your healthcare provider right away if you have any new or worsening symptoms, including:  trouble breathing or shortness of breath  cough with or without mucous  Fever  Heart problems. Hulda Humphrey may cause very slow, very fast, or abnormal heartbeats. Your healthcare provider may check your heart during treatment with XALKORI. Tell your healthcare provider right away if you feel dizzy or faint or have abnormal heartbeats. Tell your healthcare provider if you take any heart or blood pressure medicines.  Vision problems. Vision problems are common with XALKORI. These problems usually happen within 1 week of starting treatment with XALKORI. Vision problems with Hulda Humphrey can be severe and may cause partial or complete loss of vision in one or both eyes. Your healthcare provider may stop Hulda Humphrey and refer you to an eye healthcare provider if you develop severe vision problems during treatment with XALKORI. Tell your healthcare provider right away if you have any loss of vision or any change in vision, including: double vision  seeing flashes of light  blurry vision     EDUCATIONAL MATERIALS GIVEN AND REVIEWED: Specific Instructions Sheets: [place]  ON CHEMOTHERAPY: Increase your fluid intake 48 hours prior to treatment and drink at least 2 quarts per day after treatment., No alcohol intake., No aspirin or other medications unless approved by your oncologist., Eat foods that are light and easy to digest., Eat foods at cold or room temperature., No fried, fatty, or  spicy foods immediately before or after treatment., Have teeth cleaned professionally before starting treatment. Keep dentures and partial plates clean., Use soft toothbrush and do not use mouthwashes that contain alcohol. Biotene is a good mouthwash that is available at most pharmacies or may be ordered by calling [phone]., Use warm salt water gargles (1 teaspoon salt per 1 quart warm water) before and after meals and at bedtime. Or you may rinse with 2 tablespoons of three -percent hydrogen peroxide mixed in eight ounces of water., Always use sunscreen with SPF (Sun Protection Factor) of 30 or higher., Use your nausea medication as directed to prevent nausea., Use your stool softener or laxative as directed to prevent constipation. and Use your anti-diarrheal medication as directed to stop diarrhea.  Please wash your hands for at least 30 seconds using warm soapy water. Handwashing is the #1 way to prevent the spread of germs. Stay away from sick people or people who are getting over a cold. If you develop respiratory systems such as green/yellow mucus production or productive cough or persistent cough let us know and we will see if you need an antibiotic. It is a good idea to keep a pair of gloves on when going into grocery stores/[employer] to decrease your risk of coming into contact with germs on the carts, etc. Carry alcohol hand gel with you at all times and use it frequently if out in public. All foods need to be cooked thoroughly. No raw foods. No medium or undercooked meats, eggs. If your food is cooked medium well, it does not need to be hot pink or saturated with bloody liquid at all. Vegetables and fruits need to be washed/rinsed under the faucet with a dish detergent before being consumed. You can eat raw fruits and vegetables unless we tell you otherwise but it would be best if you cooked them or bought frozen. Do not eat off of salad bars or hot bars unless you really trust the cleanliness of  the restaurant. If you need dental work, please let Dr. Whitney Muse know before you go for your appointment so that we can coordinate the best possible time for you in regards to your chemo regimen. You need to also let your dentist know that you are actively taking chemo. We may need to do labs prior to your dental appointment. We also want your bowels moving at least every other day. If this is not happening, we need to know so that we can get you on a bowel regimen to help you go.      MEDICATIONS: You have been given prescriptions for the following medications:  Xalkori '250mg'$  capsule. Take 1 capsule two times a day with or without food.  Zofran '8mg'$  tablet. Take 1 tablet every 8 hours as needed for nausea/vomiting. (#1 nausea med to take, this can constipate)  Compazine '10mg'$  tablet. Take 1 tablet every 6 hours as needed for nausea/vomiting. (#2 nausea med to take, this can make you sleepy)    Over-the-Counter Meds:  Miralax 1 capful in 8 oz of fluid daily. May increase to two times a day if needed. This is a stool softener. If this doesn't work proceed  you can add:  Senokot S  - start with 1 tablet two times a day and increase to 4 tablets two times a day if needed. (total of 8 tablets in a 24 hour period). This is a stimulant laxative.   Call us if this does not help your bowels move.   Imodium '2mg'$  capsule. Take 2 capsules after the 1st loose stool and then 1 capsule every 2 hours until you go a total of 12 hours without having a loose stool. Call the [facility] if loose stools continue.   SYMPTOMS TO REPORT AS SOON AS POSSIBLE AFTER TREATMENT:  FEVER GREATER THAN 100.5 F  CHILLS WITH OR WITHOUT FEVER  NAUSEA AND VOMITING THAT IS NOT CONTROLLED WITH YOUR NAUSEA MEDICATION  UNUSUAL SHORTNESS OF BREATH  UNUSUAL BRUISING OR BLEEDING  TENDERNESS IN MOUTH AND THROAT WITH OR WITHOUT PRESENCE OF ULCERS  URINARY PROBLEMS  BOWEL PROBLEMS  UNUSUAL RASH    Wear comfortable  clothing and clothing appropriate for easy access to any Portacath or PICC line. Let us know if there is anything that we can do to make your therapy better!      I have been informed and understand all of the instructions given to me and have received a copy. I have been instructed to call the clinic [phone] or my family physician as soon as possible for continued medical care, if indicated. I do not have any more questions at this time but understand that I may call the [facility] or the Patient Navigator at [phone] during office hours should I have questions or need assistance in obtaining follow-up care.          Crizotinib oral capsules What is this medicine? CRIZOTINIB Orest Dikes [place] ti nib) is a chemotherapy drug. It targets a specific protein within cancer cells and stops the cancer cells from growing. This medicine is used to treat non-small cell lung cancer. This medicine may be used for other purposes; ask your health care provider or pharmacist if you have questions. COMMON BRAND NAME(S): Hulda Humphrey What should I tell my health care provider before I take this medicine? They need to know if you have any of these conditions: -heart disease -history of irregular heartbeat -history of low levels of calcium, magnesium, or potassium in the blood -kidney disease -liver disease -an unusual or allergic reaction to crizotinib, other medicines, foods, dyes, or preservatives -pregnant or trying to get pregnant -breast-feeding How should I use this medicine? Take this medicine by mouth with a glass of water. Follow the directions on the prescription label. Do not cut, crush or chew this medicine. You can take it with or without food. If it upsets your stomach, take it with food. If you vomit after taking your medicine, take your next dose at the regular time and do not take an extra dose. Take the doses about 12 hours apart. Take your medicine at regular intervals. Do not take  it more often than directed. Do not stop taking except on your doctor's advice. Talk to your pediatrician regarding the use of this medicine in children. Special care may be needed. Overdosage: If you think you've taken too much of this medicine contact a poison control center or emergency room at once. Overdosage: If you think you have taken too much of this medicine contact a poison control center or emergency room at once. NOTE: This medicine is only for you. Do not share this medicine with others. What if I miss  a dose? If you miss a dose, take it as soon as you can. If it is less than 6 hours before your next dose, do not make up for the missed dose and just take your next dose at your regular time. Do not take double or extra doses. What may interact with this medicine? Do not take this medicine with any of the following medications: -arsenic trioxide -astemizole -certain antibiotics like clarithromycin, erythromycin, grepafloxacin, pentamidine, sparfloxacin, troleandomycin -certain medicines for fungal infections like fluconazole, itraconazole, ketoconazole, posaconazole, voriconazole -certain medicines for irregular heart beat like amiodarone, bepridil, dofetilide, dronedarone, encainide, flecainide, propafenone, quinidine -chloroquine -cisapride -dextromethorphan; quinidine -droperidol -halofantrine -haloperidol -levomethadyl -methadone -phenothiazines like chlorpromazine, mesoridazine, thioridazine -pimozide -probucol -propafenone -saquinavir -terfenadine -ziprasidoneThis medicine may also interact with the following medications: -antiviral medicines for HIV or AIDS -certain medicines for seizures like carbamazepine, phenobarbital, phenytoin -certain medicines for stomach problems like cimetidine, famotidine, omeprazole, lansoprazole -grapefruit juice -midazolam -nefazodone -other medicines that prolong the QT interval (cause an abnormal heart  rhythm) -rifabutin -rifampin -St. John's wort, Hypericum perforatum -telithromycin This list may not describe all possible interactions. Give your health care provider a list of all the medicines, herbs, non-prescription drugs, or dietary supplements you use. Also tell them if you smoke, drink alcohol, or use illegal drugs. Some items may interact with your medicine. What should I watch for while using this medicine? Tell your doctor or healthcare professional if your symptoms do not start to get better or if they get worse. Tell your doctor about any unusual symptoms. Tell your doctor or health care professional right away if you have any change in your eyesight. Do not drive or use machinery if you have a change in your eyesight. Avoid taking products that contain aspirin, acetaminophen, ibuprofen, naproxen, or ketoprofen unless instructed by your doctor. These medicines may hide a fever. Call your doctor or health care professional for advice if you get a fever, chills or sore throat, or other symptoms of a cold or flu. Do not treat yourself. This drug decreases your body's ability to fight infections. Try to avoid being around people who are sick. This drug may make you feel generally unwell. This is not uncommon, as chemotherapy can affect healthy cells as well as cancer cells. Report any side effects. Continue your course of treatment even though you feel ill unless your doctor tells you to stop. Men and women should use effective birth control while taking this medicine and for 3 months after stopping this medicine. Do not become pregnant while taking this medicine and for at least 3 months after your last dose. Women should inform their doctor if they wish to become pregnant or think they might be pregnant. There is potential for serious side effects to an unborn child. Talk to your health care professional or pharmacist for more information. Do not breast-feed an infant while taking this  medicine. What side effects may I notice from receiving this medicine? Side effects that you should report to your doctor or health care professional as soon as possible: -allergic reactions like skin rash, itching or hives, swelling of the face, lips, or tongue -low blood counts - this medicine may decrease the number of white blood cells, red blood cells and platelets. You may be at increased risk for infections and bleeding -signs of infection - fever or chills, cough, sore throat, pain or difficulty passing urine -signs of decreased platelets or bleeding - bruising, pinpoint red spots on the skin, black, tarry stools, blood  in the urine, nosebleeds -breathing problems -changes in vision -chest pain or chest tightness -cough with or without mucous -dark urine -fast or irregular heartbeat -feeling faint or lightheaded, falls -general ill feeling or flu-like symptoms -light-colored stools -loss of appetite -pain, tingling, numbness in the hands or feet -right upper belly pain -sores or white patches in your mouth or throat -unusually weak or tired -vomiting -yellowing of the eyes or skin Side effects that usually do not require medical attention (Report these to your doctor or health care professional if they continue or are bothersome.): -changes in taste -constipation -decreased appetite -diarrhea -dizziness -headache -joint pain -nausea -swelling of the ankles, feet, hands -tired -trouble sleeping -upset stomach This list may not describe all possible side effects. Call your doctor for medical advice about side effects. You may report side effects to FDA at 1-800-FDA-1088. Where should I keep my medicine? Keep out of the reach of children. Store at room temperature between 20 and 25 degrees C (68 and 77 degrees F). Throw away any unused medicine after the expiration date. NOTE: This sheet is a summary. It may not cover all possible information. If you have questions about  this medicine, talk to your doctor, pharmacist, or health care provider.  2015, [employer]. (2012-09-21 14:15:09)

## 2014-09-02 ENCOUNTER — Encounter (HOSPITAL_BASED_OUTPATIENT_CLINIC_OR_DEPARTMENT_OTHER): Payer: Medicare Other

## 2014-09-02 VITALS — BP 112/78 | HR 96 | Temp 98.0°F | Resp 20 | Wt 165.8 lb

## 2014-09-02 DIAGNOSIS — C7801 Secondary malignant neoplasm of right lung: Secondary | ICD-10-CM

## 2014-09-02 DIAGNOSIS — C801 Malignant (primary) neoplasm, unspecified: Secondary | ICD-10-CM | POA: Diagnosis not present

## 2014-09-02 DIAGNOSIS — C3411 Malignant neoplasm of upper lobe, right bronchus or lung: Secondary | ICD-10-CM

## 2014-09-02 DIAGNOSIS — Z452 Encounter for adjustment and management of vascular access device: Secondary | ICD-10-CM

## 2014-09-02 MED ORDER — SODIUM CHLORIDE 0.9 % IJ SOLN
10.0000 mL | Freq: Once | INTRAMUSCULAR | Status: AC
  Administered 2014-09-02: 10 mL via INTRAVENOUS

## 2014-09-02 MED ORDER — HEPARIN SOD (PORK) LOCK FLUSH 100 UNIT/ML IV SOLN
250.0000 [IU] | Freq: Once | INTRAVENOUS | Status: AC
  Administered 2014-09-02: 250 [IU] via INTRAVENOUS
  Filled 2014-09-02: qty 5

## 2014-09-02 NOTE — Progress Notes (Signed)
Chemo teaching done and consent signed for Xalkori. Medication to be delivered today via a mail carrier. Chemo card given to patient.

## 2014-09-02 NOTE — Progress Notes (Signed)
Bradley Molina presented for PICC line flush. Proper placement of PICC confirmed by CXR. PICC line located RUA . Good blood return present. PICC line flushed with 65m NS and 250U/2.567mHeparin. Procedure without incident. Patient tolerated procedure well.  PICC drsg changed using sterile technique. Antimicrobial disk applied. New end cap attached & tightened.

## 2014-09-03 ENCOUNTER — Telehealth (HOSPITAL_COMMUNITY): Payer: Self-pay | Admitting: *Deleted

## 2014-09-03 NOTE — Telephone Encounter (Signed)
Started Xalkori on 09/03/14

## 2014-09-05 ENCOUNTER — Encounter (HOSPITAL_BASED_OUTPATIENT_CLINIC_OR_DEPARTMENT_OTHER): Payer: Medicare Other

## 2014-09-05 ENCOUNTER — Inpatient Hospital Stay (HOSPITAL_COMMUNITY): Payer: Medicare Other

## 2014-09-05 ENCOUNTER — Other Ambulatory Visit (HOSPITAL_COMMUNITY): Payer: Self-pay | Admitting: Oncology

## 2014-09-05 ENCOUNTER — Ambulatory Visit (HOSPITAL_COMMUNITY): Payer: Medicare Other | Admitting: Hematology & Oncology

## 2014-09-05 ENCOUNTER — Encounter (HOSPITAL_BASED_OUTPATIENT_CLINIC_OR_DEPARTMENT_OTHER): Payer: Medicare Other | Admitting: Oncology

## 2014-09-05 ENCOUNTER — Encounter (HOSPITAL_COMMUNITY): Payer: Self-pay | Admitting: Oncology

## 2014-09-05 ENCOUNTER — Other Ambulatory Visit (HOSPITAL_COMMUNITY): Payer: Self-pay | Admitting: Emergency Medicine

## 2014-09-05 ENCOUNTER — Telehealth (HOSPITAL_COMMUNITY): Payer: Self-pay | Admitting: Emergency Medicine

## 2014-09-05 ENCOUNTER — Encounter (HOSPITAL_COMMUNITY): Payer: Medicare Other

## 2014-09-05 VITALS — BP 125/87 | HR 81 | Temp 97.5°F | Resp 18 | Wt 166.2 lb

## 2014-09-05 DIAGNOSIS — C7801 Secondary malignant neoplasm of right lung: Secondary | ICD-10-CM | POA: Diagnosis not present

## 2014-09-05 DIAGNOSIS — R918 Other nonspecific abnormal finding of lung field: Secondary | ICD-10-CM | POA: Diagnosis not present

## 2014-09-05 DIAGNOSIS — Z452 Encounter for adjustment and management of vascular access device: Secondary | ICD-10-CM

## 2014-09-05 DIAGNOSIS — C3411 Malignant neoplasm of upper lobe, right bronchus or lung: Secondary | ICD-10-CM | POA: Diagnosis not present

## 2014-09-05 DIAGNOSIS — D509 Iron deficiency anemia, unspecified: Secondary | ICD-10-CM

## 2014-09-05 DIAGNOSIS — C801 Malignant (primary) neoplasm, unspecified: Secondary | ICD-10-CM | POA: Diagnosis not present

## 2014-09-05 LAB — CBC WITH DIFFERENTIAL/PLATELET
Basophils Absolute: 0 10*3/uL (ref 0.0–0.1)
Basophils Relative: 0 % (ref 0–1)
EOS PCT: 2 % (ref 0–5)
Eosinophils Absolute: 0.2 10*3/uL (ref 0.0–0.7)
HCT: 28.1 % — ABNORMAL LOW (ref 39.0–52.0)
HEMOGLOBIN: 8.1 g/dL — AB (ref 13.0–17.0)
LYMPHS ABS: 0.6 10*3/uL — AB (ref 0.7–4.0)
LYMPHS PCT: 6 % — AB (ref 12–46)
MCH: 21 pg — ABNORMAL LOW (ref 26.0–34.0)
MCHC: 28.8 g/dL — ABNORMAL LOW (ref 30.0–36.0)
MCV: 72.8 fL — ABNORMAL LOW (ref 78.0–100.0)
MONO ABS: 1.1 10*3/uL — AB (ref 0.1–1.0)
MONOS PCT: 12 % (ref 3–12)
NEUTROS ABS: 7.7 10*3/uL (ref 1.7–7.7)
Neutrophils Relative %: 80 % — ABNORMAL HIGH (ref 43–77)
Platelets: 481 10*3/uL — ABNORMAL HIGH (ref 150–400)
RBC: 3.86 MIL/uL — ABNORMAL LOW (ref 4.22–5.81)
RDW: 22.7 % — AB (ref 11.5–15.5)
WBC: 9.6 10*3/uL (ref 4.0–10.5)

## 2014-09-05 LAB — COMPREHENSIVE METABOLIC PANEL
ALBUMIN: 2 g/dL — AB (ref 3.5–5.0)
ALK PHOS: 145 U/L — AB (ref 38–126)
ALT: 18 U/L (ref 17–63)
AST: 33 U/L (ref 15–41)
Anion gap: 8 (ref 5–15)
BUN: 9 mg/dL (ref 6–20)
CALCIUM: 8.4 mg/dL — AB (ref 8.9–10.3)
CO2: 27 mmol/L (ref 22–32)
Chloride: 100 mmol/L — ABNORMAL LOW (ref 101–111)
Creatinine, Ser: 0.7 mg/dL (ref 0.61–1.24)
GFR calc Af Amer: >60 mL/min (ref >60)
GFR calc non Af Amer: >60 mL/min (ref >60)
Glucose, Bld: 84 mg/dL (ref 65–99)
POTASSIUM: 4.1 mmol/L (ref 3.5–5.1)
Sodium: 135 mmol/L (ref 135–145)
TOTAL PROTEIN: 8.3 g/dL — AB (ref 6.5–8.1)
Total Bilirubin: 0.6 mg/dL (ref 0.3–1.2)

## 2014-09-05 MED ORDER — HEPARIN SOD (PORK) LOCK FLUSH 100 UNIT/ML IV SOLN
INTRAVENOUS | Status: AC
  Filled 2014-09-05: qty 5

## 2014-09-05 MED ORDER — SODIUM CHLORIDE 0.9 % IJ SOLN
10.0000 mL | Freq: Once | INTRAMUSCULAR | Status: DC
  Administered 2014-09-05: 10 mL via INTRAVENOUS

## 2014-09-05 MED ORDER — HEPARIN SOD (PORK) LOCK FLUSH 100 UNIT/ML IV SOLN
250.0000 [IU] | Freq: Once | INTRAVENOUS | Status: AC
  Administered 2014-09-05: 250 [IU] via INTRAVENOUS

## 2014-09-05 NOTE — Telephone Encounter (Signed)
-----   Message from [facility], PA-C sent at 09/05/2014  3:26 PM EDT ----- Hgb is low, he would benefit from a transfusion next week.  Let him know.  When he comes in for blood, lets check a ferritin level too.

## 2014-09-05 NOTE — Progress Notes (Signed)
Bradley Molina presented for PICC line flsuh and lab draw.  See IV assessment in docflowsheets for PICC details.  Proper placement of PICC confirmed by CXR.  PICC located right arm.  Good blood return present. PICC flushed with 10m NS and 250U Heparin, see MAR for further details.  Bradley Molina tolerated procedure well and without incident.

## 2014-09-05 NOTE — Telephone Encounter (Signed)
Notified pt hemoglobin low, set up for blood on wednesday

## 2014-09-05 NOTE — Assessment & Plan Note (Signed)
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  Therefore, he was started on Xalkori and he started this medication on 09/03/2014.  Oncology history updated.  Labs today: CBC diff, CMET.  Hgb A1C is added and we will fax results to his primary care provider.   Education provided regarding Xalkori and MET amplification.  Continue Xalkori as prescribed.  Despite this treatment, the patient is informed that his cancer remains incurable, but we have the opportunity to possibly provide palliative treatment.  Clinically, the patient looks improved today compared to past encounters.  Return in 1 week for follow-up and labs: CBC diff, CMET.

## 2014-09-05 NOTE — Progress Notes (Signed)
[name], MD [facility] [address] [place] [address]  Malignant neoplasm of upper lobe of right lung - Plan: sodium chloride 0.9 % injection 10 mL, heparin lock flush 100 unit/mL, Hemoglobin A1c, Hemoglobin A1c, CANCELED: CBC with Differential, CANCELED: Comprehensive metabolic panel  CURRENT THERAPY: Palliative radiation and beginning Xalkori  INTERVAL HISTORY: Bradley Molina 59 y.o. male returns for followup of right upper lobe mass with right pleural effusion, initially referred to Hospice, but reporting to [facility] for second opinion regarding possible treatment options.    Lung cancer   08/04/2014 Pathology Results Diagnosis Lung, needle/core biopsy(ies), right upper lobe apical mass - POSITIVE FOR POORLY DIFFERENTIATED CARCINOMA.   08/07/2014 Treatment Plan Change 2 unit PRBC   08/15/2014 - 08/27/2014 Radiation Therapy    08/27/2014 Pathology Results [facility]- Alternations noted: MET amo, CCND1 amp, EMSY amp, FGF19 amp, FGF 3 amp, FGF 4 amp, PBRMI R954f*82, PIK3GG amp -equivocal, TP53 R273L.   09/03/2014 -  Chemotherapy Xalkori for MET amplification    He reports that his pain is improved.  He is taking MS contin 60 mg q 12 hrs with short-acting breakthrough.  Pain is well controlled currently.   He reports his appetite is stable.  He is noted to be up 2-3 lbs.   He notes that his pleural drain is no longer draining.   Clinically he looks much improved.  He started his Xalkori on 09/03/2014.  He is tolerating well.  I provided him education regarding MET amplification.   Past Medical History  Diagnosis Date  . Lung cancer   . Arthritis   . Hypertension   . Allergy   . COPD (chronic obstructive pulmonary disease)   . Diabetes mellitus without complication   . Stroke   . Hyperlipidemia     has Lung cancer on his problem list.     is allergic to penicillins.  Current Outpatient Prescriptions on File Prior to Visit  Medication  Sig Dispense Refill  . Alum & Mag Hydroxide-Simeth (MAGIC MOUTHWASH W/LIDOCAINE) SOLN Swish and swallow 573m(1 teaspoonful) every 4 hours. 360 mL 1  . ammonium lactate (AMLACTIN) 12 % cream Apply topically as needed for dry skin.    . Marland Kitchenspirin 81 MG tablet Take 81 mg by mouth daily.    . crizotinib (XALKORI) 250 MG capsule Take 1 capsule (250 mg total) by mouth 2 (two) times daily. 60 capsule 1  . HYDROcodone-acetaminophen (NORCO) 10-325 MG per tablet Take 1 tablet by mouth every 6 (six) hours as needed for moderate pain.    . Marland Kitchenevofloxacin (LEVAQUIN) 750 MG tablet Take 750 mg by mouth daily.    . metFORMIN (GLUCOPHAGE) 500 MG tablet Take 500 mg by mouth 2 (two) times daily with a meal.    . morphine (MS CONTIN) 60 MG 12 hr tablet Take 1 tablet (60 mg total) by mouth every 12 (twelve) hours. 60 tablet 0  . potassium chloride SA (K-DUR,KLOR-CON) 20 MEQ tablet Take 2 tablets (40 mEq total) by mouth 2 (two) times daily. 60 tablet 0   No current facility-administered medications on file prior to visit.    Past Surgical History  Procedure Laterality Date  . Pleurx catheter      right side    Denies any headaches, dizziness, double vision, fevers, chills, night sweats, nausea, vomiting, diarrhea, constipation, chest pain, heart palpitations, shortness of breath, blood in stool, black tarry stool, urinary pain, urinary burning, urinary frequency, hematuria.  PHYSICAL EXAMINATION  ECOG PERFORMANCE STATUS: 2 - Symptomatic, <50% confined to bed  Filed Vitals:   09/05/14 1049  BP: 125/87  Pulse: 81  Temp: 97.5 F (36.4 C)  Resp: 18    GENERAL:alert, no distress, cachectic, accompanied by wife Ivin Booty.  He looks improved today. SKIN: skin color, texture, turgor are normal, no rashes or significant lesions HEAD: Normocephalic, No masses, lesions, tenderness or abnormalities EYES: normal, PERRLA, EOMI, Conjunctiva are pink and non-injected EARS: External ears normal OROPHARYNX:lips, buccal  mucosa, and tongue normal and mucous membranes are moist  NECK: supple, trachea midline LYMPH:  No neck adenopathy noted. BREAST:not examined LUNGS: decreased breath sounds bilaterally. HEART: regular rate & rhythm ABDOMEN:abdomen soft and normal bowel sounds BACK: Back symmetric, no curvature. EXTREMITIES:less then 2 second capillary refill, no skin discoloration  NEURO: alert & oriented x 3 with fluent speech, no focal motor/sensory deficits   LABORATORY DATA: CBC    Component Value Date/Time   WBC 9.2 08/22/2014 1016   RBC 4.30 08/22/2014 1016   HGB 8.9* 08/22/2014 1016   HCT 30.7* 08/22/2014 1016   PLT 773* 08/22/2014 1016   MCV 71.4* 08/22/2014 1016   MCH 20.7* 08/22/2014 1016   MCHC 29.0* 08/22/2014 1016   RDW 23.4* 08/22/2014 1016   LYMPHSABS 1.1 08/22/2014 1016   MONOABS 1.4* 08/22/2014 1016   EOSABS 0.1 08/22/2014 1016   BASOSABS 0.0 08/22/2014 1016      Chemistry      Component Value Date/Time   NA 136 08/22/2014 1016   K 4.1 08/22/2014 1016   CL 99* 08/22/2014 1016   CO2 26 08/22/2014 1016   BUN 12 08/22/2014 1016   CREATININE 0.69 08/22/2014 1016      Component Value Date/Time   CALCIUM 9.5 08/22/2014 1016   ALKPHOS 89 08/22/2014 1016   AST 23 08/22/2014 1016   ALT 12* 08/22/2014 1016   BILITOT 0.5 08/22/2014 1016       RADIOGRAPHIC STUDIES:  Ct Abdomen Pelvis Wo Contrast  08/13/2014   CLINICAL DATA:  Initial encounter for right upper lobe lung cancer.  EXAM: CT ABDOMEN AND PELVIS WITHOUT CONTRAST  TECHNIQUE: Multidetector CT imaging of the abdomen and pelvis was performed following the standard protocol without IV contrast.  COMPARISON:  None.  FINDINGS: Lower chest: Right-sided chest to visualize with distal tip in the posterior right costophrenic sulcus. 12 mm left lower lobe pulmonary nodule has an adjacent smaller left lower lobe nodule. Small right pleural effusion evident. There is a small pericardial effusion.  Hepatobiliary: No focal  abnormality in the liver on this study without intravenous contrast. No evidence for hepatomegaly. Gallbladder is contracted. No intrahepatic or extrahepatic biliary dilation.  Pancreas: No focal mass lesion. No dilatation of the main duct. No intraparenchymal cyst. No peripancreatic edema.  Spleen: No splenomegaly. No focal mass lesion.  Adrenals/Urinary Tract: No adrenal nodule or mass. No gross lesion evident within either kidney on this noncontrast study. No hydronephrosis. No evidence for hydroureter. Urinary bladder is unremarkable.  Stomach/Bowel: Stomach is nondistended. No gastric wall thickening. No evidence of outlet obstruction. Duodenum is normally positioned as is the ligament of Treitz. No small bowel wall thickening. No small bowel dilatation. Terminal ileum not well seen. Appendix is normal. Large stool volume throughout the length of the colon.  Vascular/Lymphatic: There is abdominal aortic atherosclerosis without aneurysm. No lymphadenopathy in the abdomen or pelvis.  Reproductive: Prostate appears mildly enlarged. Seminal vesicles unremarkable.  Other: No intraperitoneal free fluid.  Musculoskeletal: Bone  windows reveal no worrisome lytic or sclerotic osseous lesions.  IMPRESSION: 1. Right-sided chest tube with small right pleural effusion. 2. Left lower lobe pulmonary nodules measuring up to 12 mm. 3. Trace to small pericardial effusion. 4. No evidence for metastatic disease in the abdomen or pelvis on this study performed without intravenous contrast.   Electronically Signed   By: Misty Stanley M.D.   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 Chronic low back pain for approximately 7 years. Staging.  EXAM: NUCLEAR MEDICINE WHOLE BODY BONE SCAN  TECHNIQUE: Whole body anterior and posterior images were obtained approximately 3 hours after intravenous injection of radiopharmaceutical.   RADIOPHARMACEUTICALS:  24.6 mCi Technetium-77mMDP IV  COMPARISON:  None.  FINDINGS: Increased activity involving the right anterior 1st rib. Increased activity involving the right maxilla. No abnormal activity to suggest metastatic disease elsewhere. Symmetric activity involving the 1st MTP joints bilaterally, the acromioclavicular joints bilaterally, and the forefeet and mid feet bilaterally. Urinary tract activity related to physiologic excretion.  IMPRESSION: 1. Activity involving the right anterior 1st rib, likely secondary to tumor involvement. 2. No evidence of metastatic disease elsewhere. 3. Activity in the right side of the maxilla, more likely related to dental disease than metastatic disease. 4. Degenerative activity involving the 1st MTP joints, the acromioclavicular joints, the forefeet and the mid feet bilaterally.   Electronically Signed   By: TEvangeline DakinM.D.   On: 08/12/2014 15:24     PATHOLOGY:  Diagnosis Lung, needle/core biopsy(ies), right upper lobe apical mass - POSITIVE FOR POORLY DIFFERENTIATED CARCINOMA.    ASSESSMENT AND PLAN:  Lung cancer Malignancy of unknown primary right a right upper lobe mass and right pleural effusion, radiographically indicative of bronchogenic primary.  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  Therefore, he was started on Xalkori and he started this medication on 09/03/2014.  Oncology history updated.  Labs today: CBC diff, CMET.  Hgb A1C is added and we will fax results to his primary care provider.   Education provided regarding Xalkori and MET amplification.  Continue Xalkori as prescribed.  Despite this treatment, the patient is informed that his cancer remains incurable, but we have the opportunity to possibly provide palliative treatment.  Clinically, the patient looks improved today compared to  past encounters.  Return in 1 week for follow-up and labs: CBC diff, CMET.     THERAPY PLAN:  Continue Xalkori as prescribed.  All questions were answered. The patient knows to call the clinic with any problems, questions or concerns. We can certainly see the patient much sooner if necessary.  Patient and plan discussed with Dr. SAncil Linseyand she is in agreement with the aforementioned.   This note is electronically signed by: KDoy Mince6/24/2016 11:22 AM

## 2014-09-05 NOTE — Addendum Note (Signed)
Addended by: Baird Cancer on: 09/05/2014 03:54 PM   Modules accepted: Orders

## 2014-09-05 NOTE — Patient Instructions (Signed)
[place] at [facility] Discharge Instructions  RECOMMENDATIONS MADE BY THE CONSULTANT AND ANY TEST RESULTS WILL BE SENT TO YOUR REFERRING PHYSICIAN.  Exam completed by Kirby Crigler today PICC line flush with lab work Labs weekly Weekly doctors appt Continue Xalkori as prescribed Please call the clinic if you have any questions or concerns  Thank you for choosing [place] at [facility] to provide your oncology and hematology care.  To afford each patient quality time with our provider, please arrive at least 15 minutes before your scheduled appointment time.    You need to re-schedule your appointment should you arrive 10 or more minutes late.  We strive to give you quality time with our providers, and arriving late affects you and other patients whose appointments are after yours.  Also, if you no show three or more times for appointments you may be dismissed from the clinic at the providers discretion.     Again, thank you for choosing [facility]  Our hope is that these requests will decrease the amount of time that you wait before being seen by our physicians.       _____________________________________________________________  Should you have questions after your visit to [facility], please contact our office at [phone] between the hours of 8:30 a.m. and 4:30 p.m.  Voicemails left after 4:30 p.m. will not be returned until the following business day.  For prescription refill requests, have your pharmacy contact our office.

## 2014-09-05 NOTE — Addendum Note (Signed)
Addended by: Elenor Legato on: 09/05/2014 12:26 PM   Modules accepted: Orders

## 2014-09-06 LAB — HEMOGLOBIN A1C
Hgb A1c MFr Bld: 5.8 % — ABNORMAL HIGH (ref 4.8–5.6)
MEAN PLASMA GLUCOSE: 120 mg/dL

## 2014-09-09 ENCOUNTER — Encounter (HOSPITAL_COMMUNITY): Payer: Self-pay

## 2014-09-09 ENCOUNTER — Encounter (HOSPITAL_BASED_OUTPATIENT_CLINIC_OR_DEPARTMENT_OTHER): Payer: Medicare Other

## 2014-09-09 DIAGNOSIS — Z452 Encounter for adjustment and management of vascular access device: Secondary | ICD-10-CM | POA: Diagnosis not present

## 2014-09-09 DIAGNOSIS — D509 Iron deficiency anemia, unspecified: Secondary | ICD-10-CM

## 2014-09-09 DIAGNOSIS — C3411 Malignant neoplasm of upper lobe, right bronchus or lung: Secondary | ICD-10-CM | POA: Diagnosis not present

## 2014-09-09 DIAGNOSIS — R918 Other nonspecific abnormal finding of lung field: Secondary | ICD-10-CM | POA: Diagnosis not present

## 2014-09-09 LAB — CBC WITH DIFFERENTIAL/PLATELET
Basophils Absolute: 0 10*3/uL (ref 0.0–0.1)
Basophils Relative: 0 % (ref 0–1)
Eosinophils Absolute: 0.3 10*3/uL (ref 0.0–0.7)
Eosinophils Relative: 4 % (ref 0–5)
HEMATOCRIT: 28.9 % — AB (ref 39.0–52.0)
HEMOGLOBIN: 8.4 g/dL — AB (ref 13.0–17.0)
LYMPHS ABS: 0.5 10*3/uL — AB (ref 0.7–4.0)
LYMPHS PCT: 6 % — AB (ref 12–46)
MCH: 21.4 pg — ABNORMAL LOW (ref 26.0–34.0)
MCHC: 29.1 g/dL — ABNORMAL LOW (ref 30.0–36.0)
MCV: 73.7 fL — AB (ref 78.0–100.0)
MONOS PCT: 13 % — AB (ref 3–12)
Monocytes Absolute: 1 10*3/uL (ref 0.1–1.0)
NEUTROS ABS: 6.1 10*3/uL (ref 1.7–7.7)
NEUTROS PCT: 77 % (ref 43–77)
PLATELETS: 526 10*3/uL — AB (ref 150–400)
RBC: 3.92 MIL/uL — AB (ref 4.22–5.81)
RDW: 21.9 % — ABNORMAL HIGH (ref 11.5–15.5)
WBC: 8 10*3/uL (ref 4.0–10.5)

## 2014-09-09 LAB — COMPREHENSIVE METABOLIC PANEL
ALK PHOS: 151 U/L — AB (ref 38–126)
ALT: 22 U/L (ref 17–63)
AST: 35 U/L (ref 15–41)
Albumin: 2.1 g/dL — ABNORMAL LOW (ref 3.5–5.0)
Anion gap: 8 (ref 5–15)
BUN: 11 mg/dL (ref 6–20)
CO2: 27 mmol/L (ref 22–32)
Calcium: 8.4 mg/dL — ABNORMAL LOW (ref 8.9–10.3)
Chloride: 102 mmol/L (ref 101–111)
Creatinine, Ser: 0.67 mg/dL (ref 0.61–1.24)
GFR calc non Af Amer: >60 mL/min (ref >60)
GLUCOSE: 126 mg/dL — AB (ref 65–99)
POTASSIUM: 4 mmol/L (ref 3.5–5.1)
SODIUM: 137 mmol/L (ref 135–145)
Total Bilirubin: 0.5 mg/dL (ref 0.3–1.2)
Total Protein: 8.8 g/dL — ABNORMAL HIGH (ref 6.5–8.1)

## 2014-09-09 LAB — PREPARE RBC (CROSSMATCH)

## 2014-09-09 LAB — FERRITIN: FERRITIN: 250 ng/mL (ref 24–336)

## 2014-09-09 MED ORDER — SODIUM CHLORIDE 0.9 % IJ SOLN
10.0000 mL | INTRAMUSCULAR | Status: DC | PRN
  Administered 2014-09-09: 10 mL via INTRAVENOUS
  Filled 2014-09-09: qty 10

## 2014-09-09 MED ORDER — HEPARIN SOD (PORK) LOCK FLUSH 100 UNIT/ML IV SOLN
250.0000 [IU] | Freq: Once | INTRAVENOUS | Status: AC
  Administered 2014-09-09: 250 [IU] via INTRAVENOUS

## 2014-09-09 NOTE — Patient Instructions (Signed)
[place] at [facility] Discharge Instructions  RECOMMENDATIONS MADE BY THE CONSULTANT AND ANY TEST RESULTS WILL BE SENT TO YOUR REFERRING PHYSICIAN.  PICC line flush and dressing change Return tomorrow for blood transfusion Follow up as scheduled   Thank you for choosing [place] at [facility] to provide your oncology and hematology care.  To afford each patient quality time with our provider, please arrive at least 15 minutes before your scheduled appointment time.    You need to re-schedule your appointment should you arrive 10 or more minutes late.  We strive to give you quality time with our providers, and arriving late affects you and other patients whose appointments are after yours.  Also, if you no show three or more times for appointments you may be dismissed from the clinic at the providers discretion.     Again, thank you for choosing [facility].  Our hope is that these requests will decrease the amount of time that you wait before being seen by our physicians.       _____________________________________________________________  Should you have questions after your visit to [facility], please contact our office at [phone] between the hours of 8:30 a.m. and 4:30 p.m.  Voicemails left after 4:30 p.m. will not be returned until the following business day.  For prescription refill requests, have your pharmacy contact our office.

## 2014-09-09 NOTE — Progress Notes (Signed)
Karin Lazare presented for PICC line flush and dressing change and blood draw.  See IV assessment in docflowsheets for PICC details.  Proper placement of PICC confirmed by CXR.  PICC located right arm.  Good blood return present. PICC flushed with 64m NS and 250U Heparin, see MAR for further details.  Deano Lundahl tolerated procedure well and without incident.

## 2014-09-10 ENCOUNTER — Encounter (HOSPITAL_BASED_OUTPATIENT_CLINIC_OR_DEPARTMENT_OTHER): Payer: Medicare Other

## 2014-09-10 ENCOUNTER — Encounter (HOSPITAL_COMMUNITY): Payer: Self-pay

## 2014-09-10 VITALS — BP 130/80 | HR 79 | Temp 97.9°F | Resp 18

## 2014-09-10 DIAGNOSIS — D509 Iron deficiency anemia, unspecified: Secondary | ICD-10-CM | POA: Diagnosis not present

## 2014-09-10 DIAGNOSIS — C3411 Malignant neoplasm of upper lobe, right bronchus or lung: Secondary | ICD-10-CM

## 2014-09-10 DIAGNOSIS — R918 Other nonspecific abnormal finding of lung field: Secondary | ICD-10-CM | POA: Diagnosis not present

## 2014-09-10 MED ORDER — DIPHENHYDRAMINE HCL 25 MG PO CAPS
25.0000 mg | ORAL_CAPSULE | Freq: Four times a day (QID) | ORAL | Status: DC | PRN
  Administered 2014-09-10: 25 mg via ORAL
  Filled 2014-09-10: qty 1

## 2014-09-10 MED ORDER — SODIUM CHLORIDE 0.9 % IJ SOLN
10.0000 mL | INTRAMUSCULAR | Status: AC | PRN
  Administered 2014-09-10: 10 mL

## 2014-09-10 MED ORDER — ACETAMINOPHEN 325 MG PO TABS
ORAL_TABLET | ORAL | Status: AC
  Filled 2014-09-10: qty 2

## 2014-09-10 MED ORDER — HYDROCODONE-ACETAMINOPHEN 10-325 MG PO TABS: 1.0000 | ORAL_TABLET | Freq: Four times a day (QID) | ORAL | Status: DC | PRN

## 2014-09-10 MED ORDER — HEPARIN SOD (PORK) LOCK FLUSH 100 UNIT/ML IV SOLN
250.0000 [IU] | INTRAVENOUS | Status: AC | PRN
  Administered 2014-09-10: 250 [IU]
  Filled 2014-09-10: qty 5

## 2014-09-10 MED ORDER — SODIUM CHLORIDE 0.9 % IV SOLN
250.0000 mL | Freq: Once | INTRAVENOUS | Status: AC
  Administered 2014-09-10: 250 mL via INTRAVENOUS

## 2014-09-10 MED ORDER — ACETAMINOPHEN 325 MG PO TABS
650.0000 mg | ORAL_TABLET | Freq: Four times a day (QID) | ORAL | Status: DC | PRN
  Administered 2014-09-10: 650 mg via ORAL

## 2014-09-10 MED ORDER — MORPHINE SULFATE ER 60 MG PO TBCR: 60.0000 mg | EXTENDED_RELEASE_TABLET | Freq: Two times a day (BID) | ORAL | Status: DC

## 2014-09-10 NOTE — Progress Notes (Signed)
Bradley Molina Tolerated blood transfusion Discharged ambulatory

## 2014-09-10 NOTE — Patient Instructions (Signed)
[facility] at [facility] Discharge Instructions  RECOMMENDATIONS MADE BY THE CONSULTANT AND ANY TEST RESULTS WILL BE SENT TO YOUR REFERRING PHYSICIAN.  2 units of blood today Follow up as scheduled Please call the clinic if you have any questions or concerns  Thank you for choosing [facility] at [facility] to provide your oncology and hematology care.  To afford each patient quality time with our provider, please arrive at least 15 minutes before your scheduled appointment time.    You need to re-schedule your appointment should you arrive 10 or more minutes late.  We strive to give you quality time with our providers, and arriving late affects you and other patients whose appointments are after yours.  Also, if you no show three or more times for appointments you may be dismissed from the clinic at the providers discretion.     Again, thank you for choosing [facility].  Our hope is that these requests will decrease the amount of time that you wait before being seen by our physicians.       _____________________________________________________________  Should you have questions after your visit to [facility], please contact our office at [phone] between the hours of 8:30 a.m. and 4:30 p.m.  Voicemails left after 4:30 p.m. will not be returned until the following business day.  For prescription refill requests, have your pharmacy contact our office.     

## 2014-09-11 LAB — TYPE AND SCREEN
ABO/RH(D): B POS
Antibody Screen: NEGATIVE
UNIT DIVISION: 0
Unit division: 0

## 2014-09-12 ENCOUNTER — Inpatient Hospital Stay (HOSPITAL_COMMUNITY): Payer: Medicare Other

## 2014-09-12 ENCOUNTER — Encounter (HOSPITAL_COMMUNITY): Payer: Self-pay | Admitting: Oncology

## 2014-09-12 ENCOUNTER — Other Ambulatory Visit (HOSPITAL_COMMUNITY): Payer: Self-pay | Admitting: Oncology

## 2014-09-12 ENCOUNTER — Encounter (HOSPITAL_COMMUNITY): Payer: Medicare Other

## 2014-09-12 ENCOUNTER — Encounter (HOSPITAL_COMMUNITY): Payer: Medicare Other | Attending: Oncology | Admitting: Oncology

## 2014-09-12 VITALS — BP 129/87 | HR 85 | Temp 98.3°F | Resp 15 | Wt 157.5 lb

## 2014-09-12 DIAGNOSIS — E8809 Other disorders of plasma-protein metabolism, not elsewhere classified: Secondary | ICD-10-CM

## 2014-09-12 DIAGNOSIS — R918 Other nonspecific abnormal finding of lung field: Secondary | ICD-10-CM | POA: Diagnosis present

## 2014-09-12 DIAGNOSIS — C3411 Malignant neoplasm of upper lobe, right bronchus or lung: Secondary | ICD-10-CM | POA: Diagnosis not present

## 2014-09-12 DIAGNOSIS — R779 Abnormality of plasma protein, unspecified: Secondary | ICD-10-CM

## 2014-09-12 LAB — CBC WITH DIFFERENTIAL/PLATELET
BASOS ABS: 0 10*3/uL (ref 0.0–0.1)
Basophils Relative: 0 % (ref 0–1)
Eosinophils Absolute: 0.4 10*3/uL (ref 0.0–0.7)
Eosinophils Relative: 5 % (ref 0–5)
HCT: 34.5 % — ABNORMAL LOW (ref 39.0–52.0)
HEMOGLOBIN: 10.6 g/dL — AB (ref 13.0–17.0)
Lymphocytes Relative: 7 % — ABNORMAL LOW (ref 12–46)
Lymphs Abs: 0.5 10*3/uL — ABNORMAL LOW (ref 0.7–4.0)
MCH: 23.2 pg — ABNORMAL LOW (ref 26.0–34.0)
MCHC: 30.7 g/dL (ref 30.0–36.0)
MCV: 75.5 fL — AB (ref 78.0–100.0)
MONO ABS: 1.1 10*3/uL — AB (ref 0.1–1.0)
MONOS PCT: 15 % — AB (ref 3–12)
Neutro Abs: 5.1 10*3/uL (ref 1.7–7.7)
Neutrophils Relative %: 72 % (ref 43–77)
Platelets: 474 10*3/uL — ABNORMAL HIGH (ref 150–400)
RBC: 4.57 MIL/uL (ref 4.22–5.81)
RDW: 21.4 % — AB (ref 11.5–15.5)
WBC: 7.1 10*3/uL (ref 4.0–10.5)

## 2014-09-12 LAB — COMPREHENSIVE METABOLIC PANEL
ALBUMIN: 2.2 g/dL — AB (ref 3.5–5.0)
ALT: 23 U/L (ref 17–63)
AST: 33 U/L (ref 15–41)
Alkaline Phosphatase: 163 U/L — ABNORMAL HIGH (ref 38–126)
Anion gap: 9 (ref 5–15)
BILIRUBIN TOTAL: 0.8 mg/dL (ref 0.3–1.2)
BUN: 12 mg/dL (ref 6–20)
CO2: 27 mmol/L (ref 22–32)
Calcium: 8.3 mg/dL — ABNORMAL LOW (ref 8.9–10.3)
Chloride: 99 mmol/L — ABNORMAL LOW (ref 101–111)
Creatinine, Ser: 0.68 mg/dL (ref 0.61–1.24)
GFR calc Af Amer: >60 mL/min (ref >60)
GFR calc non Af Amer: >60 mL/min (ref >60)
Glucose, Bld: 125 mg/dL — ABNORMAL HIGH (ref 65–99)
POTASSIUM: 3.9 mmol/L (ref 3.5–5.1)
Sodium: 135 mmol/L (ref 135–145)
TOTAL PROTEIN: 9.1 g/dL — AB (ref 6.5–8.1)

## 2014-09-12 MED ORDER — MORPHINE SULFATE ER 60 MG PO TBCR: 60.0000 mg | EXTENDED_RELEASE_TABLET | Freq: Two times a day (BID) | ORAL | Status: DC

## 2014-09-12 MED ORDER — HYDROCODONE-ACETAMINOPHEN 10-325 MG PO TABS: 1.0000 | ORAL_TABLET | Freq: Four times a day (QID) | ORAL | Status: DC | PRN

## 2014-09-12 MED ORDER — SODIUM CHLORIDE 0.9 % IJ SOLN: 10.0000 mL | INTRAMUSCULAR | Status: DC | PRN

## 2014-09-12 MED ORDER — HEPARIN SOD (PORK) LOCK FLUSH 100 UNIT/ML IV SOLN
500.0000 [IU] | Freq: Once | INTRAVENOUS | Status: DC
  Filled 2014-09-12: qty 5

## 2014-09-12 NOTE — Assessment & Plan Note (Addendum)
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  Therefore, he was started on Xalkori and he started this medication on 09/03/2014.  Labs today: CBC diff, CMET.   Refill on Hydrocodone and MS Contin provided today.  Continue Glucerna supplements and he is encouraged to increase PO food intake.  We will continue to monitor weight closely.  Return in 1 week for follow-up and labs: CBC diff, CMET.

## 2014-09-12 NOTE — Progress Notes (Unsigned)
Bradley Molina presented for PICC line flush. Proper placement of PICC confirmed by CXR. PICC line located *** . {CHL ONC AP PORTACATH BLOOD RETURN:[id number]} PICC line flushed with 35m NS and 300U/377mHeparin. Procedure without incident. Patient tolerated procedure well.

## 2014-09-12 NOTE — Progress Notes (Signed)
Mikeal Pentecost presented for labwork. Labs per MD order drawn via PICC line located in the right antecubital. Good blood return present. Procedure without incident.  PICC line flushed with 20cc NS and 300U/51m Heparin per protocol and remains intact. Patient tolerated procedure well.

## 2014-09-12 NOTE — Progress Notes (Signed)
[name], MD [facility] [address] [place] [address]  Malignant neoplasm of upper lobe of right lung - Plan: heparin lock flush 100 unit/mL, sodium chloride 0.9 % injection 10 mL, CBC with Differential, Comprehensive metabolic panel, HYDROcodone-acetaminophen (NORCO) 10-325 MG per tablet, morphine (MS CONTIN) 60 MG 12 hr tablet  CURRENT THERAPY: Palliative radiation and beginning Xalkori on 09/03/2014  INTERVAL HISTORY: Bradley Molina 59 y.o. male returns for followup of right upper lobe mass with right pleural effusion, initially referred to Hospice, but reporting to [facility] for second opinion regarding possible treatment options.    Lung cancer   08/04/2014 Pathology Results Diagnosis Lung, needle/core biopsy(ies), right upper lobe apical mass - POSITIVE FOR POORLY DIFFERENTIATED CARCINOMA.   08/07/2014 Treatment Plan Change 2 unit PRBC   08/15/2014 - 08/27/2014 Radiation Therapy    08/27/2014 Pathology Results [facility]- Alternations noted: MET amo, CCND1 amp, EMSY amp, FGF19 amp, FGF 3 amp, FGF 4 amp, PBRMI R957f*82, PIK3GG amp -equivocal, TP53 R273L.   09/03/2014 -  Chemotherapy Xalkori for MET amplification    I personally reviewed and went over laboratory results with the patient.  The results are noted within this dictation.  He is noted to be down 10 lbs but he is surprised.  He reports his appetite is stable.  He denies any noticeable change in the way his pants fit.  He is using Glucerna.  He reports that the taste of foods is affecting his desire to eat.  He is asked to force eat.  "I'm hungry, but by the time I am ready to eat, it's a waste of time."  He notes that his pain is much improved.  He requests a refill on pain medications.   Clinically, he is improved.  He is smiling and joking today.    He notes that he feels better compared to 2 months ago.  He notes floaters and "specks" in his visual field.  This could be secondary to  [facility]  I have offered a referral to Ophthalmology, but he declines.  This is a known side effects of Xalkori, occuring in up to 71% of patients, but grade 3-4 is around 1%.  Past Medical History  Diagnosis Date  . Lung cancer   . Arthritis   . Hypertension   . Allergy   . COPD (chronic obstructive pulmonary disease)   . Diabetes mellitus without complication   . Stroke   . Hyperlipidemia     has Lung cancer on his problem list.     is allergic to penicillins.  Current Outpatient Prescriptions on File Prior to Visit  Medication Sig Dispense Refill  . Alum & Mag Hydroxide-Simeth (MAGIC MOUTHWASH W/LIDOCAINE) SOLN Swish and swallow 576m(1 teaspoonful) every 4 hours. 360 mL 1  . ammonium lactate (AMLACTIN) 12 % cream Apply topically as needed for dry skin.    . Marland Kitchenspirin 81 MG tablet Take 81 mg by mouth daily.    . crizotinib (XALKORI) 250 MG capsule Take 1 capsule (250 mg total) by mouth 2 (two) times daily. 60 capsule 1  . metFORMIN (GLUCOPHAGE) 500 MG tablet Take 500 mg by mouth 2 (two) times daily with a meal.    . potassium chloride SA (K-DUR,KLOR-CON) 20 MEQ tablet Take 2 tablets (40 mEq total) by mouth 2 (two) times daily. 60 tablet 0  . lidocaine (XYLOCAINE) 2 % solution     . ondansetron (ZOFRAN) 8 MG tablet     .  prochlorperazine (COMPAZINE) 10 MG tablet      No current facility-administered medications on file prior to visit.    Past Surgical History  Procedure Laterality Date  . Pleurx catheter      right side  . Peripherally inserted central catheter insertion Right 08/2014    Denies any headaches, dizziness, double vision, fevers, chills, night sweats, nausea, vomiting, diarrhea, constipation, chest pain, heart palpitations, shortness of breath, blood in stool, black tarry stool, urinary pain, urinary burning, urinary frequency, hematuria.   PHYSICAL EXAMINATION  ECOG PERFORMANCE STATUS: 1 - Symptomatic but completely ambulatory  Filed Vitals:   09/12/14 0900    BP: 129/87  Pulse: 85  Temp: 98.3 F (36.8 C)  Resp: 15    GENERAL:alert, no distress, well developed, cachectic, comfortable, cooperative, smiling and accompanied by wife. SKIN: skin color, texture, turgor are normal, no rashes or significant lesions HEAD: Normocephalic, No masses, lesions, tenderness or abnormalities EYES: normal, PERRLA EARS: External ears normal OROPHARYNX:lips, buccal mucosa, and tongue normal and mucous membranes are moist  NECK: supple, no adenopathy, thyroid normal size, non-tender, without nodularity, trachea midline LYMPH:  no palpable lymphadenopathy BREAST:not examined LUNGS: clear to auscultation  HEART: regular rate & rhythm, no murmurs and no gallops ABDOMEN:abdomen soft and normal bowel sounds BACK: Back symmetric, no curvature., No CVA tenderness EXTREMITIES:less then 2 second capillary refill, no joint deformities, effusion, or inflammation, no skin discoloration, no cyanosis  NEURO: alert & oriented x 3 with fluent speech, no focal motor/sensory deficits   LABORATORY DATA: CBC    Component Value Date/Time   WBC 7.1 09/12/2014 0938   RBC 4.57 09/12/2014 0938   HGB 10.6* 09/12/2014 0938   HCT 34.5* 09/12/2014 0938   PLT 474* 09/12/2014 0938   MCV 75.5* 09/12/2014 0938   MCH 23.2* 09/12/2014 0938   MCHC 30.7 09/12/2014 0938   RDW 21.4* 09/12/2014 0938   LYMPHSABS 0.5* 09/12/2014 0938   MONOABS 1.1* 09/12/2014 0938   EOSABS 0.4 09/12/2014 0938   BASOSABS 0.0 09/12/2014 0938      Chemistry      Component Value Date/Time   NA 135 09/12/2014 0938   K 3.9 09/12/2014 0938   CL 99* 09/12/2014 0938   CO2 27 09/12/2014 0938   BUN 12 09/12/2014 0938   CREATININE 0.68 09/12/2014 0938      Component Value Date/Time   CALCIUM 8.3* 09/12/2014 0938   ALKPHOS 163* 09/12/2014 0938   AST 33 09/12/2014 0938   ALT 23 09/12/2014 0938   BILITOT 0.8 09/12/2014 0938        ASSESSMENT AND PLAN:  Lung cancer Malignancy of unknown primary right  a right upper lobe mass and right pleural effusion, radiographically indicative of bronchogenic primary.  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  Therefore, he was started on Xalkori and he started this medication on 09/03/2014.  Labs today: CBC diff, CMET.   Refill on Hydrocodone and MS Contin provided today.  Continue Glucerna supplements and he is encouraged to increase PO food intake.  We will continue to monitor weight closely.  Return in 1 week for follow-up and labs: CBC diff, CMET.   THERAPY PLAN:  Continue Xalkori as prescribed.  All questions were answered. The patient knows to call the clinic with any problems, questions or concerns. We can certainly see the patient much sooner if necessary.  Patient and plan discussed with  Dr. Ancil Linsey and she is in agreement with the aforementioned.   This note is electronically signed by: Doy Mince 09/12/2014 10:16 AM

## 2014-09-12 NOTE — Patient Instructions (Signed)
[place] at [facility] Discharge Instructions  RECOMMENDATIONS MADE BY THE CONSULTANT AND ANY TEST RESULTS WILL BE SENT TO YOUR REFERRING PHYSICIAN.  Exam and discussion by Robynn Pane, PA-C No changes in therapy at the present. Refill for Hydrocodone-Take as directed Report fevers, uncontrolled pain, nausea, vomiting or other concerns.  Follow-up as scheduled.  Thank you for choosing [place] at [facility] to provide your oncology and hematology care.  To afford each patient quality time with our provider, please arrive at least 15 minutes before your scheduled appointment time.    You need to re-schedule your appointment should you arrive 10 or more minutes late.  We strive to give you quality time with our providers, and arriving late affects you and other patients whose appointments are after yours.  Also, if you no show three or more times for appointments you may be dismissed from the clinic at the providers discretion.     Again, thank you for choosing [facility].  Our hope is that these requests will decrease the amount of time that you wait before being seen by our physicians.       _____________________________________________________________  Should you have questions after your visit to [facility], please contact our office at [phone] between the hours of 8:30 a.m. and 4:30 p.m.  Voicemails left after 4:30 p.m. will not be returned until the following business day.  For prescription refill requests, have your pharmacy contact our office.

## 2014-09-16 ENCOUNTER — Encounter (HOSPITAL_COMMUNITY): Payer: Medicare Other

## 2014-09-16 ENCOUNTER — Telehealth (HOSPITAL_COMMUNITY): Payer: Self-pay

## 2014-09-16 NOTE — Telephone Encounter (Signed)
Per wife, patient has not had BM in about 1 week.  Gave him miralax this am and tried to give him an emema.  Noted some blood with the enema and she stopped the enema.  Has repeated the miralax.  Has not been routinely using stool softeners.  Encouraged to use stool softener (senokot S) daily since he's taking MS contin every 12 hours and to increase number of tablets as needed.  To also get MOM and take today.  To call back in am with update.  Rescheduled PICC flush for tomorrow per request of patient.

## 2014-09-17 ENCOUNTER — Encounter (HOSPITAL_BASED_OUTPATIENT_CLINIC_OR_DEPARTMENT_OTHER): Payer: Medicare Other

## 2014-09-17 ENCOUNTER — Encounter (HOSPITAL_COMMUNITY): Payer: Self-pay

## 2014-09-17 DIAGNOSIS — Z452 Encounter for adjustment and management of vascular access device: Secondary | ICD-10-CM | POA: Diagnosis not present

## 2014-09-17 DIAGNOSIS — C3411 Malignant neoplasm of upper lobe, right bronchus or lung: Secondary | ICD-10-CM

## 2014-09-17 MED ORDER — HEPARIN SOD (PORK) LOCK FLUSH 100 UNIT/ML IV SOLN
500.0000 [IU] | Freq: Once | INTRAVENOUS | Status: AC
  Administered 2014-09-17: 250 [IU] via INTRAVENOUS

## 2014-09-17 MED ORDER — SODIUM CHLORIDE 0.9 % IJ SOLN
10.0000 mL | INTRAMUSCULAR | Status: DC | PRN
  Administered 2014-09-17: 10 mL via INTRAVENOUS
  Filled 2014-09-17: qty 10

## 2014-09-17 MED ORDER — HEPARIN SOD (PORK) LOCK FLUSH 100 UNIT/ML IV SOLN
INTRAVENOUS | Status: AC
  Filled 2014-09-17: qty 5

## 2014-09-17 NOTE — Progress Notes (Signed)
..  Bradley Molina presented for PICC flush and dressing change.  See IV assessment in docflowsheets for PICC details.  PICC located rt arm.  Good blood return present. PICC flushed with 72m NS and 250U Heparin, see MAR for further details.  Bradley Molina tolerated procedure well and without incident.

## 2014-09-19 ENCOUNTER — Encounter (HOSPITAL_COMMUNITY): Payer: Medicare Other | Attending: Oncology | Admitting: Oncology

## 2014-09-19 ENCOUNTER — Inpatient Hospital Stay (HOSPITAL_COMMUNITY): Payer: Medicare Other

## 2014-09-19 ENCOUNTER — Encounter (HOSPITAL_COMMUNITY): Payer: Medicare Other | Attending: Oncology

## 2014-09-19 ENCOUNTER — Ambulatory Visit (HOSPITAL_COMMUNITY): Payer: Medicare Other | Admitting: Oncology

## 2014-09-19 ENCOUNTER — Ambulatory Visit (HOSPITAL_COMMUNITY): Payer: Medicare Other | Admitting: Hematology & Oncology

## 2014-09-19 ENCOUNTER — Encounter (HOSPITAL_COMMUNITY): Payer: Self-pay | Admitting: Oncology

## 2014-09-19 VITALS — BP 121/96 | HR 88 | Temp 98.3°F | Resp 18 | Wt 162.5 lb

## 2014-09-19 DIAGNOSIS — C3411 Malignant neoplasm of upper lobe, right bronchus or lung: Secondary | ICD-10-CM | POA: Diagnosis not present

## 2014-09-19 DIAGNOSIS — J90 Pleural effusion, not elsewhere classified: Secondary | ICD-10-CM

## 2014-09-19 DIAGNOSIS — E8809 Other disorders of plasma-protein metabolism, not elsewhere classified: Secondary | ICD-10-CM

## 2014-09-19 DIAGNOSIS — R779 Abnormality of plasma protein, unspecified: Secondary | ICD-10-CM

## 2014-09-19 LAB — CBC WITH DIFFERENTIAL/PLATELET
BASOS ABS: 0 10*3/uL (ref 0.0–0.1)
BASOS PCT: 1 % (ref 0–1)
Eosinophils Absolute: 0.3 10*3/uL (ref 0.0–0.7)
Eosinophils Relative: 6 % — ABNORMAL HIGH (ref 0–5)
HEMATOCRIT: 32.7 % — AB (ref 39.0–52.0)
HEMOGLOBIN: 9.9 g/dL — AB (ref 13.0–17.0)
LYMPHS PCT: 6 % — AB (ref 12–46)
Lymphs Abs: 0.4 10*3/uL — ABNORMAL LOW (ref 0.7–4.0)
MCH: 23.5 pg — ABNORMAL LOW (ref 26.0–34.0)
MCHC: 30.3 g/dL (ref 30.0–36.0)
MCV: 77.7 fL — ABNORMAL LOW (ref 78.0–100.0)
Monocytes Absolute: 1.2 10*3/uL — ABNORMAL HIGH (ref 0.1–1.0)
Monocytes Relative: 21 % — ABNORMAL HIGH (ref 3–12)
NEUTROS ABS: 3.7 10*3/uL (ref 1.7–7.7)
Neutrophils Relative %: 66 % (ref 43–77)
Platelets: 545 10*3/uL — ABNORMAL HIGH (ref 150–400)
RBC: 4.21 MIL/uL — ABNORMAL LOW (ref 4.22–5.81)
RDW: 21.2 % — AB (ref 11.5–15.5)
WBC: 5.6 10*3/uL (ref 4.0–10.5)

## 2014-09-19 LAB — COMPREHENSIVE METABOLIC PANEL
ALT: 26 U/L (ref 17–63)
ANION GAP: 7 (ref 5–15)
AST: 35 U/L (ref 15–41)
Albumin: 2.1 g/dL — ABNORMAL LOW (ref 3.5–5.0)
Alkaline Phosphatase: 134 U/L — ABNORMAL HIGH (ref 38–126)
BILIRUBIN TOTAL: 0.5 mg/dL (ref 0.3–1.2)
BUN: 15 mg/dL (ref 6–20)
CHLORIDE: 102 mmol/L (ref 101–111)
CO2: 27 mmol/L (ref 22–32)
Calcium: 8.1 mg/dL — ABNORMAL LOW (ref 8.9–10.3)
Creatinine, Ser: 0.8 mg/dL (ref 0.61–1.24)
GFR calc Af Amer: >60 mL/min (ref >60)
GFR calc non Af Amer: >60 mL/min (ref >60)
GLUCOSE: 104 mg/dL — AB (ref 65–99)
POTASSIUM: 4.2 mmol/L (ref 3.5–5.1)
Sodium: 136 mmol/L (ref 135–145)
TOTAL PROTEIN: 8.4 g/dL — AB (ref 6.5–8.1)

## 2014-09-19 MED ORDER — HEPARIN SOD (PORK) LOCK FLUSH 100 UNIT/ML IV SOLN
500.0000 [IU] | Freq: Once | INTRAVENOUS | Status: AC
  Administered 2014-09-19: 300 [IU] via INTRAVENOUS
  Filled 2014-09-19: qty 5

## 2014-09-19 MED ORDER — SODIUM CHLORIDE 0.9 % IJ SOLN
10.0000 mL | INTRAMUSCULAR | Status: DC | PRN
  Administered 2014-09-19: 10 mL via INTRAVENOUS
  Filled 2014-09-19: qty 10

## 2014-09-19 NOTE — Progress Notes (Signed)
Bradley Dan, MD [facility] [address] [place] [address]  Malignant neoplasm of upper lobe of right lung - Plan: heparin lock flush 100 unit/mL, sodium chloride 0.9 % injection 10 mL  CURRENT THERAPY: Xalkori on 09/03/2014  INTERVAL HISTORY: Bradley Molina 59 y.o. male returns for followup of right upper lobe mass with right pleural effusion, initially referred to Hospice, but reporting to [facility] for second opinion regarding possible treatment options.  He is S/P palliative radiation from 6/2- 08/27/2014.  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    Lung cancer   08/04/2014 Pathology Results Diagnosis Lung, needle/core biopsy(ies), right upper lobe apical mass - POSITIVE FOR POORLY DIFFERENTIATED CARCINOMA.   08/07/2014 Treatment Plan Change 2 unit PRBC   08/14/2014 - 08/27/2014 Radiation Therapy Right upper lobe of lung to 30 Gy at 3 Gy/fraction x 10 fractions.  Dr. Pablo Ledger.   08/27/2014 Pathology Results [facility]- Alternations noted: MET amo, CCND1 amp, EMSY amp, FGF19 amp, FGF 3 amp, FGF 4 amp, PBRMI R99f*82, PIK3GG amp -equivocal, TP53 R273L.   09/03/2014 -  Chemotherapy Xalkori for MET amplification   I personally reviewed and went over laboratory results with the patient.  The results are noted within this dictation.  He previously noted floaters and "specks" in his visual field.  This could be secondary to [facility]  I have offered a referral to Ophthalmology, but he declines.  This is a known side effects of Xalkori, occuring in up to 71% of patients, but grade 3-4 is around 1%.  His weight is up about 5 lbs.  He is happy about that.  He is encouraged to maintain weight.  He notes constipation.  He notes that his last BM was on Tuesday.  He denies any abdominal pain or bloating.  He has been taking MOM, Miralax, and stool softeners.  I have made further recommendations  below.  Today he notes a right hand palmar skin sloughing.  I have a picture below in the PE.  He denies any pruritis or pain associated with this.  "I just bothers me the way it looks."  This is unilateral.  Past Medical History  Diagnosis Date  . Lung cancer   . Arthritis   . Hypertension   . Allergy   . COPD (chronic obstructive pulmonary disease)   . Diabetes mellitus without complication   . Stroke   . Hyperlipidemia     has Lung cancer on his problem list.     is allergic to penicillins.  Current Outpatient Prescriptions on File Prior to Visit  Medication Sig Dispense Refill  . Alum & Mag Hydroxide-Simeth (MAGIC MOUTHWASH W/LIDOCAINE) SOLN Swish and swallow 563m(1 teaspoonful) every 4 hours. 360 mL 1  . ammonium lactate (AMLACTIN) 12 % cream Apply topically as needed for dry skin.    . Marland Kitchenspirin 81 MG tablet Take 81 mg by mouth daily.    . crizotinib (XALKORI) 250 MG capsule Take 1 capsule (250 mg total) by mouth 2 (two) times daily. 60 capsule 1  . HYDROcodone-acetaminophen (NORCO) 10-325 MG per tablet Take 1 tablet by mouth every 6 (six) hours as needed for moderate pain. 120 tablet 0  . lidocaine (XYLOCAINE) 2 % solution     . metFORMIN (GLUCOPHAGE) 500 MG tablet Take 500 mg by mouth 2 (two) times daily with a meal.    . morphine (MS CONTIN) 60 MG 12  hr tablet Take 1 tablet (60 mg total) by mouth every 12 (twelve) hours. 60 tablet 0  . potassium chloride SA (K-DUR,KLOR-CON) 20 MEQ tablet Take 2 tablets (40 mEq total) by mouth 2 (two) times daily. 60 tablet 0  . ondansetron (ZOFRAN) 8 MG tablet     . prochlorperazine (COMPAZINE) 10 MG tablet      No current facility-administered medications on file prior to visit.    Past Surgical History  Procedure Laterality Date  . Pleurx catheter      right side  . Peripherally inserted central catheter insertion Right 08/2014    Denies any headaches, dizziness, double vision, fevers, chills, night sweats, nausea, vomiting,  diarrhea, constipation, chest pain, heart palpitations, shortness of breath, blood in stool, black tarry stool, urinary pain, urinary burning, urinary frequency, hematuria.   PHYSICAL EXAMINATION  ECOG PERFORMANCE STATUS: 1 - Symptomatic but completely ambulatory  Filed Vitals:   09/19/14 1100  BP: 121/96  Pulse: 88  Temp: 98.3 F (36.8 C)  Resp: 18    GENERAL:alert, no distress, well developed, cachectic, comfortable, cooperative, smiling and accompanied by wife. SKIN: skin color, texture, turgor are normal, no rashes or significant lesions, peeling of skin on palmar surface of right hand.  See picture below.   HEAD: Normocephalic, No masses, lesions, tenderness or abnormalities EYES: normal, PERRLA EARS: External ears normal OROPHARYNX:lips, buccal mucosa, and tongue normal and mucous membranes are moist  NECK: supple, no adenopathy, thyroid normal size, non-tender, without nodularity, trachea midline LYMPH:  no palpable lymphadenopathy BREAST:not examined LUNGS: clear to auscultation  HEART: regular rate & rhythm, no murmurs and no gallops ABDOMEN:abdomen soft and normal bowel sounds BACK: Back symmetric, no curvature., No CVA tenderness EXTREMITIES:less then 2 second capillary refill, no joint deformities, effusion, or inflammation, no skin discoloration, no cyanosis  NEURO: alert & oriented x 3 with fluent speech, no focal motor/sensory deficits   LABORATORY DATA: CBC    Component Value Date/Time   WBC 5.6 09/19/2014 1150   RBC 4.21* 09/19/2014 1150   HGB 9.9* 09/19/2014 1150   HCT 32.7* 09/19/2014 1150   PLT 545* 09/19/2014 1150   MCV 77.7* 09/19/2014 1150   MCH 23.5* 09/19/2014 1150   MCHC 30.3 09/19/2014 1150   RDW 21.2* 09/19/2014 1150   LYMPHSABS 0.4* 09/19/2014 1150   MONOABS 1.2* 09/19/2014 1150   EOSABS 0.3 09/19/2014 1150   BASOSABS 0.0 09/19/2014 1150      Chemistry      Component Value Date/Time   NA 136 09/19/2014 1150   K 4.2 09/19/2014 1150     CL 102 09/19/2014 1150   CO2 27 09/19/2014 1150   BUN 15 09/19/2014 1150   CREATININE 0.80 09/19/2014 1150      Component Value Date/Time   CALCIUM 8.1* 09/19/2014 1150   ALKPHOS 134* 09/19/2014 1150   AST 35 09/19/2014 1150   ALT 26 09/19/2014 1150   BILITOT 0.5 09/19/2014 1150        ASSESSMENT AND PLAN:  Lung cancer Malignancy of unknown primary right a right upper lobe mass and right pleural effusion, radiographically indicative of bronchogenic primary.  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  Therefore, he was started on Xalkori and he started this medication on 09/03/2014.  Continue weekly labs: CBC diff, CMET.   Continue Glucerna supplements and he is encouraged to increase PO food intake and maintain his  current weight.  We will continue to monitor weight closely.  I have recommended MOM BID today.  If no BM tomorrow, I recommended 1/2 bottle of Mag Cit tomorrow AM and finish the bottle 2 hours later if no BM.  He is to continue stool softeners as well.  He is pleased with his clinical progress and improvement this far.  Return in 1 week for follow-up and labs: CBC diff, CMET.   THERAPY PLAN:  Continue Xalkori as prescribed.  All questions were answered. The patient knows to call the clinic with any problems, questions or concerns. We can certainly see the patient much sooner if necessary.  Patient and plan discussed with Dr. Ancil Linsey and she is in agreement with the aforementioned.   This note is electronically signed by: Doy Mince 09/19/2014 12:27 PM

## 2014-09-19 NOTE — Progress Notes (Signed)
Please see office encounter for more inforamtion

## 2014-09-19 NOTE — Assessment & Plan Note (Addendum)
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  Therefore, he was started on Xalkori and he started this medication on 09/03/2014.  Continue weekly labs: CBC diff, CMET.   Continue Glucerna supplements and he is encouraged to increase PO food intake and maintain his current weight.  We will continue to monitor weight closely.  I have recommended MOM BID today.  If no BM tomorrow, I recommended 1/2 bottle of Mag Cit tomorrow AM and finish the bottle 2 hours later if no BM.  He is to continue stool softeners as well.  He is pleased with his clinical progress and improvement this far.  Return in 1 week for follow-up and labs: CBC diff, CMET.

## 2014-09-19 NOTE — Patient Instructions (Signed)
[place] at [facility] Discharge Instructions  RECOMMENDATIONS MADE BY THE CONSULTANT AND ANY TEST RESULTS WILL BE SENT TO YOUR REFERRING PHYSICIAN.  Exam and discussion by Robynn Pane, PA-C. Try Magnesium Citrate today, take milk of magnesia twice daily today, if no BM today take 1/2 bottle Magnesium Citrate and if no BM within 2 - 3 hours finish the bottle. Continue the stool softener and milk of magnesia.  Let us know on Monday if you are still having problems.  Call with any concerns or issues.  Return as scheduled.  Thank you for choosing [place] at [facility] to provide your oncology and hematology care.  To afford each patient quality time with our provider, please arrive at least 15 minutes before your scheduled appointment time.    You need to re-schedule your appointment should you arrive 10 or more minutes late.  We strive to give you quality time with our providers, and arriving late affects you and other patients whose appointments are after yours.  Also, if you no show three or more times for appointments you may be dismissed from the clinic at the providers discretion.     Again, thank you for choosing [facility].  Our hope is that these requests will decrease the amount of time that you wait before being seen by our physicians.       _____________________________________________________________  Should you have questions after your visit to [facility], please contact our office at [phone] between the hours of 8:30 a.m. and 4:30 p.m.  Voicemails left after 4:30 p.m. will not be returned until the following business day.  For prescription refill requests, have your pharmacy contact our office.

## 2014-09-19 NOTE — Progress Notes (Signed)
Bradley Molina presented for PICC line flush. Proper placement of PICC confirmed by CXR. PICC line located right arm . Good blood return present. PICC line flushed with 54m NS and 300U/322mHeparin. Procedure without incident. Patient tolerated procedure well.

## 2014-09-21 LAB — KAPPA/LAMBDA LIGHT CHAINS
KAPPA, LAMDA LIGHT CHAIN RATIO: 1.25 (ref 0.26–1.65)
Kappa free light chain: 96.98 mg/L — ABNORMAL HIGH (ref 3.30–19.40)
LAMDA FREE LIGHT CHAINS: 77.32 mg/L — AB (ref 5.71–26.30)

## 2014-09-22 LAB — MULTIPLE MYELOMA PANEL, SERUM
ALPHA2 GLOB SERPL ELPH-MCNC: 0.8 g/dL (ref 0.4–1.0)
Albumin SerPl Elph-Mcnc: 2.4 g/dL — ABNORMAL LOW (ref 2.9–4.4)
Albumin/Glob SerPl: 0.5 — ABNORMAL LOW (ref 0.7–1.7)
Alpha 1: 0.3 g/dL (ref 0.0–0.4)
B-GLOBULIN SERPL ELPH-MCNC: 1.5 g/dL — AB (ref 0.7–1.3)
GLOBULIN, TOTAL: 5.9 g/dL — AB (ref 2.2–3.9)
Gamma Glob SerPl Elph-Mcnc: 3.4 g/dL — ABNORMAL HIGH (ref 0.4–1.8)
IGG (IMMUNOGLOBIN G), SERUM: 3737 mg/dL — AB (ref 700–1600)
IGM, SERUM: 214 mg/dL — AB (ref 20–172)
IgA: 971 mg/dL — ABNORMAL HIGH (ref 90–386)
Total Protein ELP: 8.3 g/dL (ref 6.0–8.5)

## 2014-09-23 ENCOUNTER — Encounter (HOSPITAL_BASED_OUTPATIENT_CLINIC_OR_DEPARTMENT_OTHER): Payer: Medicare Other

## 2014-09-23 ENCOUNTER — Encounter (HOSPITAL_COMMUNITY): Payer: Self-pay

## 2014-09-23 VITALS — BP 116/82 | HR 74 | Resp 16

## 2014-09-23 DIAGNOSIS — Z452 Encounter for adjustment and management of vascular access device: Secondary | ICD-10-CM | POA: Diagnosis not present

## 2014-09-23 DIAGNOSIS — C3411 Malignant neoplasm of upper lobe, right bronchus or lung: Secondary | ICD-10-CM | POA: Diagnosis not present

## 2014-09-23 DIAGNOSIS — D509 Iron deficiency anemia, unspecified: Secondary | ICD-10-CM

## 2014-09-23 MED ORDER — SODIUM CHLORIDE 0.9 % IJ SOLN
10.0000 mL | INTRAMUSCULAR | Status: DC | PRN
  Administered 2014-09-23: 10 mL via INTRAVENOUS
  Filled 2014-09-23: qty 10

## 2014-09-23 MED ORDER — HEPARIN SOD (PORK) LOCK FLUSH 100 UNIT/ML IV SOLN
250.0000 [IU] | Freq: Once | INTRAVENOUS | Status: AC
  Administered 2014-09-23: 250 [IU] via INTRAVENOUS

## 2014-09-23 NOTE — Patient Instructions (Signed)
[place] at [facility] Discharge Instructions  RECOMMENDATIONS MADE BY THE CONSULTANT AND ANY TEST RESULTS WILL BE SENT TO YOUR REFERRING PHYSICIAN.  PICC line flush today Follow up as scheduled Please call the clinic if you have any questions or concerns  Thank you for choosing [place] at [facility] to provide your oncology and hematology care.  To afford each patient quality time with our provider, please arrive at least 15 minutes before your scheduled appointment time.    You need to re-schedule your appointment should you arrive 10 or more minutes late.  We strive to give you quality time with our providers, and arriving late affects you and other patients whose appointments are after yours.  Also, if you no show three or more times for appointments you may be dismissed from the clinic at the providers discretion.     Again, thank you for choosing [facility].  Our hope is that these requests will decrease the amount of time that you wait before being seen by our physicians.       _____________________________________________________________  Should you have questions after your visit to [facility], please contact our office at [phone] between the hours of 8:30 a.m. and 4:30 p.m.  Voicemails left after 4:30 p.m. will not be returned until the following business day.  For prescription refill requests, have your pharmacy contact our office.

## 2014-09-23 NOTE — Progress Notes (Signed)
Bradley Molina presented for PICC flush and dressing change.  See IV assessment in docflowsheets for PICC details.  Proper placement of PICC confirmed by CXR.  PICC located right arm.  Good blood return present. PICC flushed with 22m NS and 250U Heparin, see MAR for further details.  Bradley Molina tolerated procedure well and without incident.

## 2014-09-26 ENCOUNTER — Encounter (HOSPITAL_COMMUNITY): Payer: Self-pay | Admitting: Hematology & Oncology

## 2014-09-26 ENCOUNTER — Encounter (HOSPITAL_BASED_OUTPATIENT_CLINIC_OR_DEPARTMENT_OTHER): Payer: Medicare Other | Admitting: Hematology & Oncology

## 2014-09-26 ENCOUNTER — Encounter (HOSPITAL_BASED_OUTPATIENT_CLINIC_OR_DEPARTMENT_OTHER): Payer: Medicare Other

## 2014-09-26 DIAGNOSIS — D649 Anemia, unspecified: Secondary | ICD-10-CM | POA: Diagnosis not present

## 2014-09-26 DIAGNOSIS — C3411 Malignant neoplasm of upper lobe, right bronchus or lung: Secondary | ICD-10-CM

## 2014-09-26 DIAGNOSIS — R918 Other nonspecific abnormal finding of lung field: Secondary | ICD-10-CM | POA: Diagnosis not present

## 2014-09-26 DIAGNOSIS — J90 Pleural effusion, not elsewhere classified: Secondary | ICD-10-CM | POA: Diagnosis not present

## 2014-09-26 DIAGNOSIS — M549 Dorsalgia, unspecified: Secondary | ICD-10-CM

## 2014-09-26 LAB — COMPREHENSIVE METABOLIC PANEL
ALBUMIN: 2.1 g/dL — AB (ref 3.5–5.0)
ALT: 29 U/L (ref 17–63)
AST: 35 U/L (ref 15–41)
Alkaline Phosphatase: 130 U/L — ABNORMAL HIGH (ref 38–126)
Anion gap: 6 (ref 5–15)
BUN: 14 mg/dL (ref 6–20)
CALCIUM: 8 mg/dL — AB (ref 8.9–10.3)
CO2: 27 mmol/L (ref 22–32)
Chloride: 103 mmol/L (ref 101–111)
Creatinine, Ser: 0.88 mg/dL (ref 0.61–1.24)
GFR calc Af Amer: >60 mL/min (ref >60)
GFR calc non Af Amer: >60 mL/min (ref >60)
GLUCOSE: 91 mg/dL (ref 65–99)
Potassium: 4.1 mmol/L (ref 3.5–5.1)
Sodium: 136 mmol/L (ref 135–145)
Total Bilirubin: 0.5 mg/dL (ref 0.3–1.2)
Total Protein: 7.9 g/dL (ref 6.5–8.1)

## 2014-09-26 LAB — CBC WITH DIFFERENTIAL/PLATELET
Basophils Absolute: 0 10*3/uL (ref 0.0–0.1)
Basophils Relative: 0 % (ref 0–1)
Eosinophils Absolute: 0.3 10*3/uL (ref 0.0–0.7)
Eosinophils Relative: 5 % (ref 0–5)
HCT: 29.4 % — ABNORMAL LOW (ref 39.0–52.0)
Hemoglobin: 9 g/dL — ABNORMAL LOW (ref 13.0–17.0)
Lymphocytes Relative: 9 % — ABNORMAL LOW (ref 12–46)
Lymphs Abs: 0.5 10*3/uL — ABNORMAL LOW (ref 0.7–4.0)
MCH: 23.9 pg — ABNORMAL LOW (ref 26.0–34.0)
MCHC: 30.6 g/dL (ref 30.0–36.0)
MCV: 78.2 fL (ref 78.0–100.0)
Monocytes Absolute: 0.9 10*3/uL (ref 0.1–1.0)
Monocytes Relative: 18 % — ABNORMAL HIGH (ref 3–12)
NEUTROS ABS: 3.3 10*3/uL (ref 1.7–7.7)
Neutrophils Relative %: 68 % (ref 43–77)
Platelets: 456 10*3/uL — ABNORMAL HIGH (ref 150–400)
RBC: 3.76 MIL/uL — ABNORMAL LOW (ref 4.22–5.81)
RDW: 21 % — AB (ref 11.5–15.5)
WBC: 4.9 10*3/uL (ref 4.0–10.5)

## 2014-09-26 MED ORDER — SODIUM CHLORIDE 0.9 % IJ SOLN
10.0000 mL | INTRAMUSCULAR | Status: DC | PRN
  Administered 2014-09-26: 10 mL via INTRAVENOUS
  Filled 2014-09-26: qty 10

## 2014-09-26 MED ORDER — HEPARIN SOD (PORK) LOCK FLUSH 100 UNIT/ML IV SOLN
250.0000 [IU] | Freq: Once | INTRAVENOUS | Status: AC
  Administered 2014-09-26: 250 [IU] via INTRAVENOUS

## 2014-09-26 MED ORDER — HEPARIN SOD (PORK) LOCK FLUSH 100 UNIT/ML IV SOLN
INTRAVENOUS | Status: AC
  Filled 2014-09-26: qty 5

## 2014-09-26 MED ORDER — CRIZOTINIB 250 MG PO CAPS: 250.0000 mg | ORAL_CAPSULE | Freq: Two times a day (BID) | ORAL | Status: DC

## 2014-09-26 NOTE — Progress Notes (Signed)
PICC line removed.  Pressure vasoline dressing applied.  Discharged instructions give.  Site clean dry intact.  Observed after procedure.  Verbalized instructions

## 2014-09-26 NOTE — Progress Notes (Signed)
..  Bradley Molina presented for PICC flush and labs.  See IV assessment in docflowsheets for PICC details.    PICC located lt arm.  Good blood return present. PICC flushed with 18m NS and 250U Heparin, see MAR for further details.  Bradley Molina tolerated procedure well and without incident.

## 2014-09-26 NOTE — Progress Notes (Signed)
See MD encounter 

## 2014-09-26 NOTE — Progress Notes (Signed)
[place] at [facility]  CHIEF COMPLAINTS/PURPOSE OF CONSULTATION:  Lung cancer, poorly differentiated carcinoma Recent CVA 06/30/14 Admitted at [facility]. 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 male is here for additional f/u of his stage IV NSCLC, Met amplification. He continues on [place].   The patient is present with his wife today who states that he is doing better overall.  He uses a walker and still feels "wobbly" a little bit; he walks to the bathroom at home.  He has been to church twice. He notes that his biggest issues revolve around his CVA.   His wife says that he does not seem to be in as much pain as he previously was.  He takes his long acting medication along with his short acting in between. He is also eating better saying that though food does not taste good sometimes, but he still forces it down. The patient says that on a scale from 0-10, his daily pain ranges from a 3-4.  He states that he sleeps better.  He denies depression.  He denies cough. He has intermittent SOB but not worse than previously.  MEDICAL HISTORY:  Past Medical History  Diagnosis Date  . Lung cancer   . Arthritis   . Hypertension   . Allergy   . COPD (chronic obstructive pulmonary disease)   . Diabetes mellitus without complication   . Stroke   . Hyperlipidemia     SURGICAL HISTORY: Past Surgical History  Procedure Laterality Date  . Pleurx catheter      right side  . Peripherally inserted central catheter insertion Right 08/2014    SOCIAL HISTORY: History   Social History  . Marital Status: Married    Spouse Name: N/A  . Number of Children: N/A  . Years of Education: N/A   Occupational History  . Not on file.   Social History Main Topics  . Smoking  status: Former Smoker -- 1.00 packs/day for 40 years    Types: Cigarettes    Quit date: 06/17/2014  . Smokeless tobacco: Not on file  . Alcohol Use: No  . Drug Use: No  . Sexual Activity: Not on file   Other Topics Concern  . Not on file   Social History Narrative  Married for 25 years with 3 children and 7 grandchildren He worked with furniture Former smoker; quit last month. Prior 40 to 50 pack year history Former drinker  FAMILY HISTORY: Family History  Problem Relation Age of Onset  . Cancer Mother   . COPD Mother   . Emphysema Father    indicated that his mother is deceased. He indicated that his father is deceased.  Mother was in early 91s when she died of lung cancer. Father died of emphysema. His brother died.  3 living sisters, 2 other sisters died.  ALLERGIES:  is allergic to penicillins.  MEDICATIONS:  Current Outpatient Prescriptions  Medication Sig Dispense Refill  . Alum & Mag Hydroxide-Simeth (MAGIC MOUTHWASH W/LIDOCAINE) SOLN Swish and swallow 37m (1 teaspoonful) every 4 hours. 360 mL 1  . ammonium lactate (AMLACTIN) 12 % cream Apply topically as needed for dry skin.    .Marland Kitchenaspirin 81 MG tablet Take 81 mg by mouth daily. Taking 2 twice a day per patient    .  crizotinib (XALKORI) 250 MG capsule Take 1 capsule (250 mg total) by mouth 2 (two) times daily. 60 capsule 1  . docusate sodium (EQUATE STOOL SOFTENER) 100 MG capsule Take 100 mg by mouth 2 (two) times daily.    Marland Kitchen HYDROcodone-acetaminophen (NORCO) 10-325 MG per tablet Take 1 tablet by mouth every 6 (six) hours as needed for moderate pain. 120 tablet 0  . magnesium hydroxide (MILK OF MAGNESIA) 800 MG/5ML suspension Take 15 mLs by mouth 2 (two) times daily as needed for constipation.    . metFORMIN (GLUCOPHAGE) 500 MG tablet Take 500 mg by mouth 2 (two) times daily with a meal.    . morphine (MS CONTIN) 60 MG 12 hr tablet Take 1 tablet (60 mg total) by mouth every 12 (twelve) hours. 60 tablet 0  .  polyethylene glycol (MIRALAX / GLYCOLAX) packet Take 17 g by mouth daily.    . potassium chloride SA (K-DUR,KLOR-CON) 20 MEQ tablet Take 2 tablets (40 mEq total) by mouth 2 (two) times daily. 60 tablet 0  . lidocaine (XYLOCAINE) 2 % solution     . ondansetron (ZOFRAN) 8 MG tablet     . prochlorperazine (COMPAZINE) 10 MG tablet      No current facility-administered medications for this visit.    Review of Systems  Constitutional: Positive for malaise/fatigue. Negative for fever, chills and weight loss.  HENT: Negative for congestion, ear discharge, ear pain, hearing loss, nosebleeds, sore throat and tinnitus.   Eyes: Negative for blurred vision, double vision, photophobia, pain, discharge and redness.  Respiratory: Negative for cough and stridor.   Cardiovascular: Negative for chest pain, palpitations, orthopnea, claudication, leg swelling and PND.  Gastrointestinal: Negative for nausea, vomiting and abdominal pain.  Genitourinary: Negative for dysuria and hematuria.  Musculoskeletal: Positive for joint pain and neck pain. Negative for myalgias.  Skin: Negative for rash.  Neurological: Positive for focal weakness and weakness. Negative for dizziness, tingling, tremors, sensory change, speech change, seizures, loss of consciousness and headaches.       Uses walker  Endo/Heme/Allergies: Negative.   Psychiatric/Behavioral: Negative for depression, suicidal ideas, hallucinations, memory loss and substance abuse. The patient has insomnia.    14 point ROS was done and is otherwise as detailed above or in HPI   PHYSICAL EXAMINATION: ECOG PERFORMANCE STATUS: 2 - Symptomatic, <50% confined to bed  Filed Vitals:   09/26/14 0852  BP: 126/80  Pulse: 98  Temp: 98 F (36.7 C)  Resp: 18   Filed Weights   09/26/14 0852  Weight: 164 lb 1.6 oz (74.435 kg)    Physical Exam  Constitutional: He is oriented to person, place, and time. No distress.  thin  HENT:  Head: Normocephalic and  atraumatic.  Mouth/Throat: Oropharynx is clear and moist. No oropharyngeal exudate.  Eyes: Conjunctivae are normal. Pupils are equal, round, and reactive to light. Right eye exhibits no discharge. Left eye exhibits no discharge. No scleral icterus.  Neck: Normal range of motion. Neck supple.  Cardiovascular: Normal rate and regular rhythm.   Pulmonary/Chest:  Decreased BS R lung, occasional rhonchi L lung. pleurx catheter C/D/I  Abdominal: Soft. Bowel sounds are normal. He exhibits no distension and no mass. There is no tenderness. There is no rebound and no guarding.  Musculoskeletal: Normal range of motion.  Lymphadenopathy:    He has no cervical adenopathy.  Neurological: He is alert and oriented to person, place, and time.  Uses walker, decreased strength R side but mostly in RLE  Skin: Skin  is warm and dry. He is not diaphoretic. No erythema.  Psychiatric: Mood, memory, affect and judgment normal.    LABORATORY DATA:  I have reviewed the data as listed Lab Results  Component Value Date   WBC 5.6 09/19/2014   HGB 9.9* 09/19/2014   HCT 32.7* 09/19/2014   MCV 77.7* 09/19/2014   PLT 545* 09/19/2014    06/19/2014 Right pleural fluid shows inflammatory infiltrate consisting predominantly of histiocytes and lymphocytes with occasional acute inflammatory cells  07/02/2014 ECHO with 60-65% EF   ASSESSMENT & PLAN:  Abnormal CT imaging of the chest Large Pleural Effusion, pleurx catheter placement Performance status of 2 Pain, chronic back pain, RU shoulder pain (acute) Anemia  59 year old male with prior tobacco history who presents with a lung mass, pleural-based lung mass and pleural effusion. He presented with shortness of breath and weakness. CT findings on workup were grossly abnormal as detailed above, thoracentesis was performed but no malignant cells were noted. He is unfortunately sustained a CVA since his cancer diagnosis.  He now needs a walker for ambulation.  Pain is  markedly improved. In quality of life is fairly decent. 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 He does attend church from time to time and states that he has visitors very frequently. His weight is stable. He continues on [place] with good tolerance.  We will remove his PICC today. I will see him back again in 2 weeks with labs and physical exam.  We will order imaging studies to re-stage at his next follow-up.   His medications were refilled.   Orders Placed This Encounter  Procedures  . CBC with Differential    Standing Status: Future     Number of Occurrences: 1     Standing Expiration Date: 09/26/2015  . Comprehensive metabolic panel    Standing Status: Future     Number of Occurrences: 1     Standing Expiration Date: 09/26/2015    This note was electronically signed.   This document serves as a record of services personally performed by Ancil Linsey, MD. It was created on her behalf by Janace Hoard, a trained medical scribe. The creation of this record is based on the scribe's personal observations and the provider's statements to them. This document has been checked and approved by the attending provider.    I have reviewed the above documentation for accuracy and completeness, and I agree with the above.  Kelby Fam. Eldwin Volkov MD

## 2014-09-26 NOTE — Patient Instructions (Signed)
..[place] at [facility] Discharge Instructions  RECOMMENDATIONS MADE BY THE CONSULTANT AND ANY TEST RESULTS WILL BE SENT TO YOUR REFERRING PHYSICIAN.  Exam per Dr. Whitney Muse  Picc line removal today  2 week follow up    Thank you for choosing [place] at [facility] to provide your oncology and hematology care.  To afford each patient quality time with our provider, please arrive at least 15 minutes before your scheduled appointment time.    You need to re-schedule your appointment should you arrive 10 or more minutes late.  We strive to give you quality time with our providers, and arriving late affects you and other patients whose appointments are after yours.  Also, if you no show three or more times for appointments you may be dismissed from the clinic at the providers discretion.     Again, thank you for choosing [facility].  Our hope is that these requests will decrease the amount of time that you wait before being seen by our physicians.       _____________________________________________________________  Should you have questions after your visit to [facility], please contact our office at [phone] between the hours of 8:30 a.m. and 4:30 p.m.  Voicemails left after 4:30 p.m. will not be returned until the following business day.  For prescription refill requests, have your pharmacy contact our office.   PICC Removal, Care After  Refer to this sheet in the next few weeks. These instructions provide you with information on caring for yourself after your procedure. Your health care provider may also give you more specific instructions. Your treatment has been planned according to current medical practices, but problems sometimes occur. Call your health care provider if you have any problems or questions after your procedure. WHAT TO EXPECT AFTER THE PROCEDURE After your procedure, it is typical to have mild  discomfort at the insertion site. This should not last for more than a day. HOME CARE INSTRUCTIONS You may remove the bandage after 24 hours. The PICC insertion site is very small. A small scab may develop over the insertion site.  It is okay to wash the site gently with soap and water. Be careful not to remove or pick off the scab. Gently pat the site dry after washing it. You do not need to put another bandage over the insertion site. Do not lift anything heavy or do strenuous physical activity for 24 hours after the PICC is removed. This includes:  Weight lifting.  Strenuous yard work.  Any physical activity with repetitive arm movement.  SEEK MEDICAL CARE IF:   You have swelling or puffiness in your arm at the PICC insertion site.  You have increasing tenderness at the PICC insertion site. SEEK IMMEDIATE MEDICAL CARE IF:   You have numbness or tingling in your fingers, hand, or arm.  Your arm looks blue and feels cold.  You have redness around the insertion site or a red streak goes up your arm.  You have any type of drainage from the PICC insertion site. This includes drainage such as:  Bleeding from the insertion site. If this happens, apply firm, direct pressure to the PICC insertion site with a clean towel.  Drainage that is yellow or tan.  You have a fever. Document Released: 03/05/2013 Document Reviewed: 03/05/2013 [facility] Patient Information 2015 [place], [place]. This information is not intended to replace advice given to you by your health care  provider. Make sure you discuss any questions you have with your health care provider.

## 2014-09-29 ENCOUNTER — Other Ambulatory Visit (HOSPITAL_COMMUNITY): Payer: Self-pay | Admitting: Oncology

## 2014-09-29 ENCOUNTER — Ambulatory Visit (HOSPITAL_COMMUNITY): Payer: Medicare Other | Admitting: Hematology & Oncology

## 2014-10-10 ENCOUNTER — Encounter (HOSPITAL_BASED_OUTPATIENT_CLINIC_OR_DEPARTMENT_OTHER): Payer: Medicare Other

## 2014-10-10 ENCOUNTER — Encounter (HOSPITAL_BASED_OUTPATIENT_CLINIC_OR_DEPARTMENT_OTHER): Payer: Medicare Other | Admitting: Hematology & Oncology

## 2014-10-10 VITALS — BP 127/85 | HR 73 | Temp 98.1°F | Resp 18 | Wt 165.4 lb

## 2014-10-10 DIAGNOSIS — D649 Anemia, unspecified: Secondary | ICD-10-CM

## 2014-10-10 DIAGNOSIS — C3411 Malignant neoplasm of upper lobe, right bronchus or lung: Secondary | ICD-10-CM | POA: Diagnosis not present

## 2014-10-10 DIAGNOSIS — R918 Other nonspecific abnormal finding of lung field: Secondary | ICD-10-CM | POA: Diagnosis not present

## 2014-10-10 LAB — COMPREHENSIVE METABOLIC PANEL
ALK PHOS: 149 U/L — AB (ref 38–126)
ALT: 28 U/L (ref 17–63)
AST: 36 U/L (ref 15–41)
Albumin: 2.1 g/dL — ABNORMAL LOW (ref 3.5–5.0)
Anion gap: 4 — ABNORMAL LOW (ref 5–15)
BUN: 13 mg/dL (ref 6–20)
CO2: 27 mmol/L (ref 22–32)
Calcium: 8.1 mg/dL — ABNORMAL LOW (ref 8.9–10.3)
Chloride: 107 mmol/L (ref 101–111)
Creatinine, Ser: 0.83 mg/dL (ref 0.61–1.24)
GFR calc non Af Amer: >60 mL/min (ref >60)
Glucose, Bld: 116 mg/dL — ABNORMAL HIGH (ref 65–99)
Potassium: 4.5 mmol/L (ref 3.5–5.1)
SODIUM: 138 mmol/L (ref 135–145)
Total Bilirubin: 0.4 mg/dL (ref 0.3–1.2)
Total Protein: 8 g/dL (ref 6.5–8.1)

## 2014-10-10 LAB — CBC WITH DIFFERENTIAL/PLATELET
BASOS ABS: 0 10*3/uL (ref 0.0–0.1)
Basophils Relative: 0 % (ref 0–1)
EOS ABS: 0.3 10*3/uL (ref 0.0–0.7)
Eosinophils Relative: 5 % (ref 0–5)
HEMATOCRIT: 32.3 % — AB (ref 39.0–52.0)
Hemoglobin: 9.8 g/dL — ABNORMAL LOW (ref 13.0–17.0)
LYMPHS ABS: 0.9 10*3/uL (ref 0.7–4.0)
Lymphocytes Relative: 14 % (ref 12–46)
MCH: 24.1 pg — AB (ref 26.0–34.0)
MCHC: 30.3 g/dL (ref 30.0–36.0)
MCV: 79.6 fL (ref 78.0–100.0)
Monocytes Absolute: 0.7 10*3/uL (ref 0.1–1.0)
Monocytes Relative: 12 % (ref 3–12)
Neutro Abs: 4.1 10*3/uL (ref 1.7–7.7)
Neutrophils Relative %: 69 % (ref 43–77)
Platelets: 443 10*3/uL — ABNORMAL HIGH (ref 150–400)
RBC: 4.06 MIL/uL — ABNORMAL LOW (ref 4.22–5.81)
RDW: 19.1 % — ABNORMAL HIGH (ref 11.5–15.5)
WBC: 6 10*3/uL (ref 4.0–10.5)

## 2014-10-10 MED ORDER — MORPHINE SULFATE ER 60 MG PO TBCR: 60.0000 mg | EXTENDED_RELEASE_TABLET | Freq: Two times a day (BID) | ORAL | Status: DC

## 2014-10-10 MED ORDER — HYDROCODONE-ACETAMINOPHEN 10-325 MG PO TABS: 1.0000 | ORAL_TABLET | Freq: Four times a day (QID) | ORAL | Status: DC | PRN

## 2014-10-10 NOTE — Progress Notes (Signed)
LABS DRAWN

## 2014-10-10 NOTE — Patient Instructions (Signed)
[place] at [facility] Discharge Instructions  RECOMMENDATIONS MADE BY THE CONSULTANT AND ANY TEST RESULTS WILL BE SENT TO YOUR REFERRING PHYSICIAN.  Exam and discussion by Dr. Whitney Muse.  Will get you scheduled to get the pleurx catheter removed. Will do CT of your Chest to see how you're doing. Refills for MS contin and Hydrocodone.  Follow-up after scans.  Thank you for choosing [place] at [facility] to provide your oncology and hematology care.  To afford each patient quality time with our provider, please arrive at least 15 minutes before your scheduled appointment time.    You need to re-schedule your appointment should you arrive 10 or more minutes late.  We strive to give you quality time with our providers, and arriving late affects you and other patients whose appointments are after yours.  Also, if you no show three or more times for appointments you may be dismissed from the clinic at the providers discretion.     Again, thank you for choosing [facility].  Our hope is that these requests will decrease the amount of time that you wait before being seen by our physicians.       _____________________________________________________________  Should you have questions after your visit to [facility], please contact our office at [phone] between the hours of 8:30 a.m. and 4:30 p.m.  Voicemails left after 4:30 p.m. will not be returned until the following business day.  For prescription refill requests, have your pharmacy contact our office.

## 2014-10-10 NOTE — Progress Notes (Signed)
[place] at [facility] Progress Note   CHIEF COMPLAINTS/PURPOSE OF CONSULTATION:  Second opinion on: Lung cancer, no biopsy Recent CVA 06/30/14 Admitted at [facility]. He presented with left sided weakness and slurred speech Anemia Pleurx catheter placement in right lung, malignant plural effusion MET amplification XALKORI  HISTORY OF PRESENTING ILLNESS:  Bradley Molina 59 y.o. male is here for follow up . 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 He continues on [place].  He was admitted to [facility] for SOB on 06/19/2014. He was noted to have a large R pleural effusion. A thoracentesis was performed that showed no malignant cells. 1.6 L of fluid was removed. He was noted to have a large pleural-based mass as well as a right upper lobe mass. Hemoglobin was reported as 7.5 and he was given 2 units of packed red cells. He complained of shortness of breath weight loss and fatigue. Ultimately he had a Pleurx catheter placed for palliation and was discharged with hospice. It was felt to be too difficult to obtain a diagnostic biopsy given his underlying lung disease.  Since his lung cancer diagnosis he has sustained a CVA.He has had difficulty walking and R sided weakness since the stroke. He requires a walker.   He says he is doing okay and he's here with his wife today.He feels better than he did a few months ago with a positive outlook on his condition. He has been eating well, his wife agrees. He feels lazy and tired. The pain is mostly gone, though it is still there to a degree. He tries to get up and get dressed every morning. He tries to go to church every Sunday and  Wednesday. His friends visit him, though he does not go to visit them. He has no trouble getting his medications or taking them. His Pleurx catheter has no drainage and they are inquiring whether he can have this removed.  MEDICAL HISTORY:  Past Medical History  Diagnosis Date  . Lung cancer   . Arthritis   . Hypertension   . Allergy   . COPD (chronic obstructive pulmonary disease)   . Diabetes mellitus without complication   . Stroke   . Hyperlipidemia     SURGICAL HISTORY: Past Surgical History  Procedure Laterality Date  . Pleurx catheter      right side  . Peripherally inserted central catheter insertion Right 08/2014    SOCIAL HISTORY: Social History   Social History  . Marital Status: Married    Spouse Name: N/A  . Number of Children: N/A  . Years of Education: N/A   Occupational History  . Not on file.   Social History Main Topics  . Smoking status: Former Smoker -- 1.00 packs/day for 40 years    Types: Cigarettes    Quit date: 06/17/2014  . Smokeless tobacco: Not on file  . Alcohol Use: No  . Drug Use: No  . Sexual Activity: Not on file   Other Topics Concern  . Not on file   Social History Narrative  Married for 25 years with 3 children and 7 grandchildren He worked with furniture Former smoker; quit last month. Prior 40 to 50 pack year history Former drinker  FAMILY HISTORY: Family History  Problem Relation Age  of Onset  . Cancer Mother   . COPD Mother   . Emphysema Father    indicated that his mother is deceased. He indicated that his father is deceased.  Mother was in early 71s when she died of lung cancer. Father died of emphysema. His brother died.  3 living sisters, 2 other sisters died.  ALLERGIES:  is allergic to penicillins.  MEDICATIONS:  Current Outpatient Prescriptions  Medication Sig Dispense Refill  . Alum & Mag Hydroxide-Simeth (MAGIC MOUTHWASH W/LIDOCAINE) SOLN Swish and swallow 14m (1 teaspoonful) every 4 hours.  (Patient taking differently: Take 5 mLs by mouth 4 (four) times daily as needed for mouth pain. ) 360 mL 1  . ammonium lactate (AMLACTIN) 12 % cream Apply topically as needed for dry skin.    .Marland Kitchenaspirin 81 MG tablet Take 162 mg by mouth 2 (two) times daily.     . crizotinib (XALKORI) 250 MG capsule Take 1 capsule (250 mg total) by mouth 2 (two) times daily. 60 capsule 3  . docusate sodium (EQUATE STOOL SOFTENER) 100 MG capsule Take 100 mg by mouth 2 (two) times daily as needed for mild constipation.     .Marland KitchenHYDROcodone-acetaminophen (NORCO) 10-325 MG per tablet Take 1 tablet by mouth every 6 (six) hours as needed for moderate pain. 120 tablet 0  . magnesium hydroxide (MILK OF MAGNESIA) 800 MG/5ML suspension Take 15 mLs by mouth 2 (two) times daily as needed for constipation.    . metFORMIN (GLUCOPHAGE) 500 MG tablet Take 500 mg by mouth 2 (two) times daily with a meal.    . morphine (MS CONTIN) 60 MG 12 hr tablet Take 1 tablet (60 mg total) by mouth every 12 (twelve) hours. 60 tablet 0  . polyethylene glycol (MIRALAX / GLYCOLAX) packet Take 17 g by mouth daily as needed for mild constipation.     . potassium chloride SA (K-DUR,KLOR-CON) 20 MEQ tablet Take 2 tablets (40 mEq total) by mouth 2 (two) times daily. 60 tablet 0  . ondansetron (ZOFRAN) 8 MG tablet Take 8 mg by mouth every 8 (eight) hours as needed for nausea or vomiting.     . prochlorperazine (COMPAZINE) 10 MG tablet Take 10 mg by mouth every 6 (six) hours as needed for nausea or vomiting.      No current facility-administered medications for this visit.    Review of Systems  Constitutional: Positive for malaise/fatigue. Negative for fever, chills and weight loss.  HENT: Negative for congestion, ear discharge, ear pain, hearing loss, nosebleeds, sore throat and tinnitus.   Eyes: Negative for blurred vision, double vision, photophobia, pain, discharge and redness.  Respiratory: Negative for cough and stridor.   Cardiovascular: Negative  for chest pain, palpitations, orthopnea, claudication, leg swelling and PND.  Gastrointestinal: Negative for nausea, vomiting and abdominal pain.  Genitourinary: Negative for dysuria and hematuria.  Musculoskeletal: Positive for joint pain and neck pain. Negative for myalgias.  Skin: Negative for rash.  Neurological: Positive for focal weakness and weakness. Negative for dizziness, tingling, tremors, sensory change, speech change, seizures, loss of consciousness and headaches.       Uses walker  Endo/Heme/Allergies: Negative.   Psychiatric/Behavioral: Negative for depression, suicidal ideas, hallucinations, memory loss and substance abuse. The patient has insomnia.    14 point ROS was done and is otherwise as detailed above or in HPI   PHYSICAL EXAMINATION: ECOG PERFORMANCE STATUS: 2 - Symptomatic, <50% confined to bed  Filed Vitals:   10/10/14 1000  BP: 127/85  Pulse: 73  Temp: 98.1 F (36.7 C)  Resp: 18   Filed Weights   10/10/14 1000  Weight: 165 lb 6.4 oz (75.025 kg)   Physical Exam  Constitutional: He is oriented to person, place, and time. No distress.  thin  HENT:  Head: Normocephalic and atraumatic.  Mouth/Throat: Oropharynx is clear and moist. No oropharyngeal exudate.  Eyes: Conjunctivae are normal. Pupils are equal, round, and reactive to light. Right eye exhibits no discharge. Left eye exhibits no discharge. No scleral icterus.  Neck: Normal range of motion. Neck supple.  Cardiovascular: Normal rate and regular rhythm.   Pulmonary/Chest:  Abdominal: Soft. Bowel sounds are normal. He exhibits no distension and no mass. There is no tenderness. There is no rebound and no guarding.  Musculoskeletal: Normal range of motion.  Lymphadenopathy:    He has no cervical adenopathy.  Neurological: He is alert and oriented to person, place, and time.  Uses walker, decreased strength R side but mostly in RLE  Skin: Skin is warm and dry. He is not diaphoretic. No erythema.    Psychiatric: Mood, memory, affect and judgment normal.    LABORATORY DATA:  I have reviewed the data as listed Lab Results  Component Value Date   WBC 6.0 10/10/2014   HGB 9.8* 10/10/2014   HCT 32.3* 10/10/2014   MCV 79.6 10/10/2014   PLT 443* 10/10/2014   CMP     Component Value Date/Time   NA 138 10/10/2014 0955   K 4.5 10/10/2014 0955   CL 107 10/10/2014 0955   CO2 27 10/10/2014 0955   GLUCOSE 116* 10/10/2014 0955   BUN 13 10/10/2014 0955   CREATININE 0.70 10/16/2014 1409   CALCIUM 8.1* 10/10/2014 0955   PROT 8.0 10/10/2014 0955   ALBUMIN 2.1* 10/10/2014 0955   AST 36 10/10/2014 0955   ALT 28 10/10/2014 0955   ALKPHOS 149* 10/10/2014 0955   BILITOT 0.4 10/10/2014 0955   GFRNONAA >60 10/10/2014 0955   GFRAA >60 10/10/2014 0955     ASSESSMENT & PLAN:  CUP, most consistent with LUNG primary Biopsy on 08/04/2014 c/w poorly differentiated carcinoma Recent CVA 06/30/14 Admitted at [facility]. He presented with left sided weakness and slurred speech Anemia Pleurx catheter placement in right lung, malignant plural effusion MET amplification XALKORI Palliative XRT with good pain control   He is doing fairly well. Pain is markedly improved. He tolerates XALKORI without any problem. He attends church on a regular basis. His major limitations unfortunately have resulted from a CVA.  Since he has not had any significant drainage from his Pleurx we will look into having this removed. We have removed his PICC line. He does not have a Port-A-Cath. His CBCs have shown that his hemoglobin has stabilized.  I have refilled his prescriptions for both pain medications today.  We will schedule for repeat scans in 1.5 weeks with follow-up with me to review the results.    Orders Placed This Encounter  Procedures  . CT Chest W Contrast    Standing Status: Future     Number of Occurrences: 1     Standing Expiration Date: 10/10/2015    Order Specific Question:   Reason for Exam (SYMPTOM  OR DIAGNOSIS REQUIRED)    Answer:  stage IV NSCLC, restaging    Order Specific Question:  Preferred imaging location?    Answer:  [facility]  . IR Removal Of Plural Cath W/Cuff    Standing Status: Future  Number of Occurrences: 1     Standing Expiration Date: 12/11/2015    Order Specific Question:  Reason for exam:    Answer:  Remove pleurx cath    Order Specific Question:  Preferred Imaging Location?    Answer:  [facility]  . CBC with Differential    Standing Status: Future     Number of Occurrences:      Standing Expiration Date: 10/10/2015  . Comprehensive metabolic panel    Standing Status: Future     Number of Occurrences:      Standing Expiration Date: 10/10/2015    All questions were answered. The patient knows to call the clinic with any problems, questions or concerns.  This note was electronically signed.   This document serves as a record of services personally performed by Ancil Linsey, MD. It was created on her behalf by Arlyce Harman, a trained medical scribe. The creation of this record is based on the scribe's personal observations and the provider's statements to them. This document has been checked and approved by the attending provider.   I have reviewed the above documentation for accuracy and completeness, and I agree with the above.  Kelby Fam. Penland MD

## 2014-10-16 ENCOUNTER — Ambulatory Visit (HOSPITAL_COMMUNITY)
Admission: RE | Admit: 2014-10-16 | Discharge: 2014-10-16 | Disposition: A | Discharge status: Home / Self Care | Payer: Medicare Other | Source: Ambulatory Visit | Attending: Hematology & Oncology | Admitting: Hematology & Oncology

## 2014-10-16 DIAGNOSIS — M8458XA Pathological fracture in neoplastic disease, other specified site, initial encounter for fracture: Secondary | ICD-10-CM | POA: Diagnosis not present

## 2014-10-16 DIAGNOSIS — Z08 Encounter for follow-up examination after completed treatment for malignant neoplasm: Secondary | ICD-10-CM | POA: Insufficient documentation

## 2014-10-16 DIAGNOSIS — J90 Pleural effusion, not elsewhere classified: Secondary | ICD-10-CM | POA: Insufficient documentation

## 2014-10-16 DIAGNOSIS — C3411 Malignant neoplasm of upper lobe, right bronchus or lung: Secondary | ICD-10-CM | POA: Insufficient documentation

## 2014-10-16 DIAGNOSIS — C7951 Secondary malignant neoplasm of bone: Secondary | ICD-10-CM | POA: Insufficient documentation

## 2014-10-16 DIAGNOSIS — R918 Other nonspecific abnormal finding of lung field: Secondary | ICD-10-CM | POA: Insufficient documentation

## 2014-10-16 DIAGNOSIS — R590 Localized enlarged lymph nodes: Secondary | ICD-10-CM | POA: Diagnosis not present

## 2014-10-16 LAB — POCT I-STAT CREATININE: CREATININE: 0.7 mg/dL (ref 0.61–1.24)

## 2014-10-16 MED ORDER — IOHEXOL 300 MG/ML  SOLN
75.0000 mL | Freq: Once | INTRAMUSCULAR | Status: AC | PRN
  Administered 2014-10-16: 75 mL via INTRAVENOUS

## 2014-10-17 ENCOUNTER — Other Ambulatory Visit: Payer: Self-pay | Admitting: Radiology

## 2014-10-17 ENCOUNTER — Encounter (HOSPITAL_COMMUNITY): Payer: Medicare Other | Attending: Oncology | Admitting: Oncology

## 2014-10-17 ENCOUNTER — Encounter (HOSPITAL_COMMUNITY): Payer: Self-pay | Admitting: Oncology

## 2014-10-17 VITALS — BP 121/80 | HR 68 | Temp 98.4°F | Resp 18 | Wt 165.6 lb

## 2014-10-17 DIAGNOSIS — C801 Malignant (primary) neoplasm, unspecified: Secondary | ICD-10-CM | POA: Diagnosis not present

## 2014-10-17 DIAGNOSIS — J90 Pleural effusion, not elsewhere classified: Secondary | ICD-10-CM

## 2014-10-17 DIAGNOSIS — C7801 Secondary malignant neoplasm of right lung: Secondary | ICD-10-CM | POA: Diagnosis not present

## 2014-10-17 DIAGNOSIS — K5900 Constipation, unspecified: Secondary | ICD-10-CM

## 2014-10-17 DIAGNOSIS — R918 Other nonspecific abnormal finding of lung field: Secondary | ICD-10-CM | POA: Insufficient documentation

## 2014-10-17 DIAGNOSIS — C3411 Malignant neoplasm of upper lobe, right bronchus or lung: Secondary | ICD-10-CM

## 2014-10-17 NOTE — Patient Instructions (Signed)
[place] at [facility] Discharge Instructions  RECOMMENDATIONS MADE BY THE CONSULTANT AND ANY TEST RESULTS WILL BE SENT TO YOUR REFERRING PHYSICIAN.  Exam and discussion with Kirby Crigler, PA-C. For constipation:  Take Milk of Magnesia twice a day and Senekot-S 3 tablets twice a day until you have a bowel movement.        After this, you may use Milk of Magnesia as needed, but continue the Senekot-S TWO tablets twice a day.  Return in 3 weeks for lab work and and office visit with Dr. Whitney Muse.    Thank you for choosing [place] at [facility] to provide your oncology and hematology care.  To afford each patient quality time with our provider, please arrive at least 15 minutes before your scheduled appointment time.    You need to re-schedule your appointment should you arrive 10 or more minutes late.  We strive to give you quality time with our providers, and arriving late affects you and other patients whose appointments are after yours.  Also, if you no show three or more times for appointments you may be dismissed from the clinic at the providers discretion.     Again, thank you for choosing [facility].  Our hope is that these requests will decrease the amount of time that you wait before being seen by our physicians.       _____________________________________________________________  Should you have questions after your visit to [facility], please contact our office at [phone] between the hours of 8:30 a.m. and 4:30 p.m.  Voicemails left after 4:30 p.m. will not be returned until the following business day.  For prescription refill requests, have your pharmacy contact our office.

## 2014-10-17 NOTE — Progress Notes (Signed)
Bradley Dan, MD [facility] [address] [place] [address]  Malignant neoplasm of upper lobe of right lung - Plan: CT Chest W Contrast  CURRENT THERAPY: Xalkori on 09/03/2014  INTERVAL HISTORY: Bradley Molina 59 y.o. male returns for followup of right upper lobe mass with right pleural effusion, initially referred to Hospice, but reporting to [facility] for second opinion regarding possible treatment options.  He is S/P palliative radiation from 6/2- 08/27/2014.  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    Lung cancer   08/04/2014 Pathology Results Diagnosis Lung, needle/core biopsy(ies), right upper lobe apical mass - POSITIVE FOR POORLY DIFFERENTIATED CARCINOMA.   08/07/2014 Treatment Plan Change 2 unit PRBC   08/14/2014 - 08/27/2014 Radiation Therapy Right upper lobe of lung to 30 Gy at 3 Gy/fraction x 10 fractions.  Dr. Pablo Ledger.   08/27/2014 Pathology Results FoundationOne- Alternations noted: MET amo, CCND1 amp, EMSY amp, FGF19 amp, FGF 3 amp, FGF 4 amp, PBRMI R956f*82, PIK3GG amp -equivocal, TP53 R273L.   09/03/2014 -  Chemotherapy Xalkori for MET amplification   10/17/2014 Imaging CT chest-  Mildly decreased primary malignancy in the central right upper lobe, now 8.6 x 4.8 cm. Stable direct invasion of the anterior mediastinum by the right upper lobe mass. Slightly increased direct invasion of the right superior pulmonary vei...   I personally reviewed and went over laboratory results with the patient.  The results are noted within this dictation.  I personally reviewed and went over radiographic studies with the patient.  The results are noted within this dictation.  We reviewed his recent restaging CT of chest performed yesterday.  His weight is stable.  He is having issues with constipation.  Past Medical History  Diagnosis Date  . Lung cancer   . Arthritis   . Hypertension   .  Allergy   . COPD (chronic obstructive pulmonary disease)   . Diabetes mellitus without complication   . Stroke   . Hyperlipidemia     has Lung cancer on his problem list.     is allergic to penicillins.  Current Outpatient Prescriptions on File Prior to Visit  Medication Sig Dispense Refill  . Alum & Mag Hydroxide-Simeth (MAGIC MOUTHWASH W/LIDOCAINE) SOLN Swish and swallow 564m(1 teaspoonful) every 4 hours. (Patient taking differently: Take 5 mLs by mouth 4 (four) times daily as needed for mouth pain. ) 360 mL 1  . ammonium lactate (AMLACTIN) 12 % cream Apply topically as needed for dry skin.    . Marland Kitchenspirin 81 MG tablet Take 162 mg by mouth 2 (two) times daily.     . crizotinib (XALKORI) 250 MG capsule Take 1 capsule (250 mg total) by mouth 2 (two) times daily. 60 capsule 3  . docusate sodium (EQUATE STOOL SOFTENER) 100 MG capsule Take 100 mg by mouth 2 (two) times daily as needed for mild constipation.     . Marland KitchenYDROcodone-acetaminophen (NORCO) 10-325 MG per tablet Take 1 tablet by mouth every 6 (six) hours as needed for moderate pain. 120 tablet 0  . magnesium hydroxide (MILK OF MAGNESIA) 800 MG/5ML suspension Take 15 mLs by mouth 2 (two) times daily as needed for constipation.    . metFORMIN (GLUCOPHAGE) 500 MG tablet Take 500 mg by mouth 2 (two) times daily with a meal.    . morphine (MS CONTIN) 60 MG 12 hr tablet Take 1 tablet (60 mg total) by  mouth every 12 (twelve) hours. 60 tablet 0  . ondansetron (ZOFRAN) 8 MG tablet Take 8 mg by mouth every 8 (eight) hours as needed for nausea or vomiting.     . polyethylene glycol (MIRALAX / GLYCOLAX) packet Take 17 g by mouth daily as needed for mild constipation.     . potassium chloride SA (K-DUR,KLOR-CON) 20 MEQ tablet Take 2 tablets (40 mEq total) by mouth 2 (two) times daily. 60 tablet 0  . prochlorperazine (COMPAZINE) 10 MG tablet Take 10 mg by mouth every 6 (six) hours as needed for nausea or vomiting.      No current facility-administered  medications on file prior to visit.    Past Surgical History  Procedure Laterality Date  . Pleurx catheter      right side  . Peripherally inserted central catheter insertion Right 08/2014    Denies any headaches, dizziness, double vision, fevers, chills, night sweats, nausea, vomiting, diarrhea, constipation, chest pain, heart palpitations, shortness of breath, blood in stool, black tarry stool, urinary pain, urinary burning, urinary frequency, hematuria.   PHYSICAL EXAMINATION  ECOG PERFORMANCE STATUS: 1 - Symptomatic but completely ambulatory  There were no vitals filed for this visit.  GENERAL:alert, no distress, well developed, cachectic, comfortable, cooperative, smiling and accompanied by wife. SKIN: skin color, texture, turgor are normal, no rashes or significant lesions HEAD: Normocephalic, No masses, lesions, tenderness or abnormalities EYES: normal, PERRLA EARS: External ears normal OROPHARYNX:lips, buccal mucosa, and tongue normal and mucous membranes are moist  NECK: supple, no adenopathy, thyroid normal size, non-tender, without nodularity, trachea midline LYMPH:  no palpable lymphadenopathy BREAST:not examined LUNGS: clear to auscultation  HEART: regular rate & rhythm, no murmurs and no gallops ABDOMEN:abdomen soft and normal bowel sounds BACK: Back symmetric, no curvature., No CVA tenderness EXTREMITIES:less then 2 second capillary refill, no joint deformities, effusion, or inflammation, no skin discoloration, no cyanosis  NEURO: alert & oriented x 3 with fluent speech, no focal motor/sensory deficits   LABORATORY DATA: CBC    Component Value Date/Time   WBC 6.0 10/10/2014 0955   RBC 4.06* 10/10/2014 0955   HGB 9.8* 10/10/2014 0955   HCT 32.3* 10/10/2014 0955   PLT 443* 10/10/2014 0955   MCV 79.6 10/10/2014 0955   MCH 24.1* 10/10/2014 0955   MCHC 30.3 10/10/2014 0955   RDW 19.1* 10/10/2014 0955   LYMPHSABS 0.9 10/10/2014 0955   MONOABS 0.7 10/10/2014  0955   EOSABS 0.3 10/10/2014 0955   BASOSABS 0.0 10/10/2014 0955      Chemistry      Component Value Date/Time   NA 138 10/10/2014 0955   K 4.5 10/10/2014 0955   CL 107 10/10/2014 0955   CO2 27 10/10/2014 0955   BUN 13 10/10/2014 0955   CREATININE 0.70 10/16/2014 1409      Component Value Date/Time   CALCIUM 8.1* 10/10/2014 0955   ALKPHOS 149* 10/10/2014 0955   AST 36 10/10/2014 0955   ALT 28 10/10/2014 0955   BILITOT 0.4 10/10/2014 0955     RADIOLOGY:  CLINICAL DATA: 59 year old male with stage IV non-small-cell lung cancer diagnosed in April 2016, presenting for restaging.  EXAM: CT CHEST WITH CONTRAST  TECHNIQUE: Multidetector CT imaging of the chest was performed during intravenous contrast administration.  CONTRAST: 96m OMNIPAQUE IOHEXOL 300 MG/ML SOLN  COMPARISON: 08/04/2014 chest radiograph. 06/18/2014 chest CT.  FINDINGS: Mediastinum/Nodes: Normal heart size. There is mild pericardial fluid/ thickening, slightly decreased since 08/12/2014 CT abdomen study. There is atherosclerosis of the thoracic  aorta, the great vessels of the mediastinum and the coronary arteries, including calcified atherosclerotic plaque in the left anterior descending, left circumflex and right coronary arteries. Great vessels are normal in course and caliber. No central pulmonary emboli. Normal visualized thyroid. Normal esophagus. No axillary lymphadenopathy. There is a 1.6 cm short axis subcarinal node (2/28), decreased from 2.7 cm. No pathologically enlarged left hilar nodes.  Lungs/Pleura: Right-sided chest tube enters the right pleural space anteriorly between the fifth and sixth ribs, and terminates in the posterior basilar right pleural space. There is a trace residual right pleural effusion, which is decreased compared to the 08/12/2014 CT study. No left pleural effusion. No pneumothorax. There is an 8.6 x 4.8 cm central right upper lobe lung mass  (3/26), which is decreased from 9.4 x 7.4 cm on 06/18/14. The right upper lobe mass demonstrates stable direct invasion of the anterior mediastinum (a 4.6 x 2.7 cm anterior mediastinal tumor component on series 2/image 20 previously measured 4.7 x 2.8 cm on 06/18/14, not appreciably changed) and increased direct invasion of the right superior pulmonary vein and right anterior aspect of the left atrium (a 5.0 x 3.2 cm tumor component on series 2/images 30 previously measured 4.8 x 3.0 cm, slightly increased with increased extension into the left atrium). There are stable subpleural pulmonary nodules in the left upper lobe measuring 7 mm (3/27) and 5 mm (3/29). A subpleural 4 mm posterior left lower lobe pulmonary nodule (3/30) is decreased from 7 mm on 08/12/2014. A 3 mm subpleural posterior left lower lobe pulmonary nodule (3/39) is decreased from 11 mm. The 2 additional subcentimeter left lower lobe pulmonary nodules visualized on the 08/12/2014 CT abdomen study have resolved. There are no new pulmonary nodules. Re- demonstrated is prominent paraseptal emphysema, upper lobe predominant, severe at the left lung apex.  Upper abdomen: Hypodense 0.4 cm and 0.5 cm right liver lobe lesions (2/47 and 2/50) are too small to characterize, which were not definitely present on the prior CT studies. Simple 1.5 cm upper left renal cyst. Moderate degenerative changes in the thoracic spine.  Musculoskeletal: There are enlarging right posterior first rib, right posterior third rib and right manubrial lytic metastases, with associated subacute nondisplaced pathologic fracture of the right posterior first rib.  IMPRESSION: 1. Mildly decreased primary malignancy in the central right upper lobe, now 8.6 x 4.8 cm. Stable direct invasion of the anterior mediastinum by the right upper lobe mass. Slightly increased direct invasion of the right superior pulmonary vein and left atrium by the right upper  lobe mass. 2. Trace residual right pleural effusion with right chest tube in place. Mild pericardial fluid/thickening, slightly decreased. 3. Partial treatment response in the subcarinal mediastinal lymphadenopathy and in the left lower lobe pulmonary nodules. 4. 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   Electronically Signed  By: Ilona Sorrel M.D.  On: 10/16/2014 16:28    ASSESSMENT AND PLAN:  Lung cancer Malignancy of unknown primary right a right upper lobe mass and right pleural effusion, radiographically indicative of bronchogenic primary.  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  Therefore, he was started on Xalkori and he started this medication on 09/03/2014.  Labs every 3 weeks: CBC diff, CMET  Repeat CT of chest in 8 weeks for restaging.  Return in 3 weeks for follow-up.   THERAPY PLAN:  Continue Xalkori as prescribed. We will restage with CT of chest 8 weeks from his last one.  All questions were answered. The patient knows to call the clinic with any problems, questions or concerns. We can certainly see the patient much sooner if necessary.  Patient and plan discussed with Dr. Ancil Linsey and she is in agreement with the aforementioned.   This note is electronically signed by: Doy Mince 10/17/2014 8:55 AM

## 2014-10-17 NOTE — Assessment & Plan Note (Addendum)
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  Therefore, he was started on Xalkori and he started this medication on 09/03/2014.  Labs every 3 weeks: CBC diff, CMET  Repeat CT of chest in 8 weeks for restaging.  Cancel next Friday's appt.  Bowel Regimen reviewed.  I have recommended MOM BID and SenoKot-S 3 tabs BID until BM.  Then he can decrease MOM to PRN and Senokot-S to 2 tabs BID.  Return in 3 weeks for follow-up.

## 2014-10-20 ENCOUNTER — Ambulatory Visit (HOSPITAL_COMMUNITY)
Admission: RE | Admit: 2014-10-20 | Discharge: 2014-10-20 | Disposition: A | Discharge status: Home / Self Care | Payer: Medicare Other | Source: Ambulatory Visit | Attending: Hematology & Oncology | Admitting: Hematology & Oncology

## 2014-10-20 ENCOUNTER — Encounter (HOSPITAL_COMMUNITY): Payer: Self-pay

## 2014-10-20 ENCOUNTER — Ambulatory Visit (HOSPITAL_COMMUNITY)
Admission: RE | Admit: 2014-10-20 | Discharge: 2014-10-20 | Disposition: A | Discharge status: Home / Self Care | Payer: Medicare Other | Source: Ambulatory Visit | Attending: Oncology | Admitting: Oncology

## 2014-10-20 ENCOUNTER — Ambulatory Visit (HOSPITAL_COMMUNITY)
Admission: RE | Admit: 2014-10-20 | Discharge: 2014-10-20 | Disposition: A | Discharge status: Home / Self Care | Payer: Medicare Other | Source: Ambulatory Visit | Attending: Interventional Radiology | Admitting: Interventional Radiology

## 2014-10-20 DIAGNOSIS — C3411 Malignant neoplasm of upper lobe, right bronchus or lung: Secondary | ICD-10-CM | POA: Insufficient documentation

## 2014-10-20 DIAGNOSIS — M1990 Unspecified osteoarthritis, unspecified site: Secondary | ICD-10-CM | POA: Insufficient documentation

## 2014-10-20 DIAGNOSIS — I10 Essential (primary) hypertension: Secondary | ICD-10-CM | POA: Insufficient documentation

## 2014-10-20 DIAGNOSIS — I2510 Atherosclerotic heart disease of native coronary artery without angina pectoris: Secondary | ICD-10-CM | POA: Diagnosis not present

## 2014-10-20 DIAGNOSIS — Z880 Allergy status to penicillin: Secondary | ICD-10-CM | POA: Diagnosis not present

## 2014-10-20 DIAGNOSIS — Z7982 Long term (current) use of aspirin: Secondary | ICD-10-CM | POA: Diagnosis not present

## 2014-10-20 DIAGNOSIS — I69354 Hemiplegia and hemiparesis following cerebral infarction affecting left non-dominant side: Secondary | ICD-10-CM | POA: Insufficient documentation

## 2014-10-20 DIAGNOSIS — Z4803 Encounter for change or removal of drains: Secondary | ICD-10-CM | POA: Insufficient documentation

## 2014-10-20 DIAGNOSIS — E785 Hyperlipidemia, unspecified: Secondary | ICD-10-CM | POA: Insufficient documentation

## 2014-10-20 DIAGNOSIS — N281 Cyst of kidney, acquired: Secondary | ICD-10-CM | POA: Diagnosis not present

## 2014-10-20 DIAGNOSIS — J449 Chronic obstructive pulmonary disease, unspecified: Secondary | ICD-10-CM | POA: Diagnosis not present

## 2014-10-20 DIAGNOSIS — Z87891 Personal history of nicotine dependence: Secondary | ICD-10-CM | POA: Insufficient documentation

## 2014-10-20 DIAGNOSIS — E119 Type 2 diabetes mellitus without complications: Secondary | ICD-10-CM | POA: Insufficient documentation

## 2014-10-20 DIAGNOSIS — Z4682 Encounter for fitting and adjustment of non-vascular catheter: Secondary | ICD-10-CM

## 2014-10-20 LAB — PROTIME-INR
INR: 1.05 (ref 0.00–1.49)
PROTHROMBIN TIME: 13.9 s (ref 11.6–15.2)

## 2014-10-20 LAB — GLUCOSE, CAPILLARY: Glucose-Capillary: 98 mg/dL (ref 65–99)

## 2014-10-20 MED ORDER — VANCOMYCIN HCL IN DEXTROSE 1-5 GM/200ML-% IV SOLN
1000.0000 mg | Freq: Once | INTRAVENOUS | Status: AC
  Administered 2014-10-20: 1000 mg via INTRAVENOUS
  Filled 2014-10-20: qty 200

## 2014-10-20 MED ORDER — LIDOCAINE-EPINEPHRINE 2 %-1:100000 IJ SOLN
INTRAMUSCULAR | Status: AC
  Filled 2014-10-20: qty 1

## 2014-10-20 MED ORDER — MIDAZOLAM HCL 2 MG/2ML IJ SOLN
INTRAMUSCULAR | Status: AC
  Filled 2014-10-20: qty 6

## 2014-10-20 MED ORDER — SODIUM CHLORIDE 0.9 % IV SOLN
INTRAVENOUS | Status: DC
  Administered 2014-10-20: 10:00:00 via INTRAVENOUS

## 2014-10-20 MED ORDER — MIDAZOLAM HCL 2 MG/2ML IJ SOLN
INTRAMUSCULAR | Status: AC | PRN
  Administered 2014-10-20 (×2): 1 mg via INTRAVENOUS

## 2014-10-20 MED ORDER — FENTANYL CITRATE (PF) 100 MCG/2ML IJ SOLN
INTRAMUSCULAR | Status: AC | PRN
  Administered 2014-10-20: 50 ug via INTRAVENOUS

## 2014-10-20 MED ORDER — FENTANYL CITRATE (PF) 100 MCG/2ML IJ SOLN
INTRAMUSCULAR | Status: AC
  Filled 2014-10-20: qty 4

## 2014-10-20 NOTE — Procedures (Signed)
Successful bedside removal of tunneled pleural catheter. Post procedural CXR negative for the presence of a PTX. No immediate post procedural complication.

## 2014-10-20 NOTE — H&P (Signed)
Chief Complaint: Patient was seen in consultation today for right pleurx catheter removal   Referring Physician(s): Kefalas,Thomas S  History of Present Illness: Bradley Molina is a 59 y.o. 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 Pt was also noted to have a large RUL mass with associated adenopathy/right pleural effusion. He had right pleurx catheter placement at [facility] 06/2014 and CT guided RUL lung mass biopsy 5/23 at [facility] revealing poorly diff carcinoma. Recent f/u CT on 10/16/14 revealed trace rt effusion. Pt's wife states that pleurx has not drained very much since May of this year. He presents today for right pleurx cath removal.   Past Medical History  Diagnosis Date  . Lung cancer   . Arthritis   . Hypertension   . Allergy   . COPD (chronic obstructive pulmonary disease)   . Diabetes mellitus without complication   . Stroke   . Hyperlipidemia     Past Surgical History  Procedure Laterality Date  . Pleurx catheter      right side  . Peripherally inserted central catheter insertion Right 08/2014    Allergies: Penicillins  Medications: Prior to Admission medications   Medication Sig Start Date End Date Taking? Authorizing Provider  Alum & Mag Hydroxide-Simeth (MAGIC MOUTHWASH W/LIDOCAINE) SOLN Swish and swallow 75m (1 teaspoonful) every 4 hours. Patient taking differently: Take 5 mLs by mouth 4 (four) times daily as needed for mouth pain.  08/29/14  Yes SPatrici Ranks MD  ammonium lactate (AMLACTIN) 12 % cream Apply topically as needed for dry skin.   Yes KDaneil Dan MD  aspirin 81 MG tablet Take 162 mg by mouth 2 (two) times daily.    Yes KDaneil Dan MD  crizotinib (Hulda Humphrey 250 MG capsule Take 1 capsule (250 mg total) by mouth 2 (two) times daily. 09/26/14  Yes SPatrici Ranks MD  docusate sodium (EQUATE STOOL SOFTENER) 100 MG capsule Take 100 mg by mouth 2 (two) times  daily as needed for mild constipation.    Yes Historical Provider, MD  HYDROcodone-acetaminophen (NORCO) 10-325 MG per tablet Take 1 tablet by mouth every 6 (six) hours as needed for moderate pain. 10/10/14  Yes SPatrici Ranks MD  magnesium hydroxide (MILK OF MAGNESIA) 800 MG/5ML suspension Take 15 mLs by mouth 2 (two) times daily as needed for constipation.   Yes Historical Provider, MD  metFORMIN (GLUCOPHAGE) 500 MG tablet Take 500 mg by mouth 2 (two) times daily with a meal.   Yes KDaneil Dan MD  morphine (MS CONTIN) 60 MG 12 hr tablet Take 1 tablet (60 mg total) by mouth every 12 (twelve) hours. 10/10/14  Yes SPatrici Ranks MD  polyethylene glycol (MIRALAX / GLYCOLAX) packet Take 17 g by mouth daily as needed for mild constipation.    Yes Historical Provider, MD  potassium chloride SA (K-DUR,KLOR-CON) 20 MEQ tablet Take 2 tablets (40 mEq total) by mouth 2 (two) times daily. 08/04/14  Yes SPatrici Ranks MD  ondansetron (ZOFRAN) 8 MG tablet Take 8 mg by mouth every 8 (eight) hours as needed for nausea or vomiting.  08/25/14   Historical Provider, MD  prochlorperazine (COMPAZINE) 10 MG tablet Take 10 mg by mouth every 6 (six) hours as needed for nausea or vomiting.  08/25/14   Historical Provider, MD     Family History  Problem Relation Age of Onset  . Cancer Mother   .  COPD Mother   . Emphysema Father     History   Social History  . Marital Status: Married    Spouse Name: N/A  . Number of Children: N/A  . Years of Education: N/A   Social History Main Topics  . Smoking status: Former Smoker -- 1.00 packs/day for 40 years    Types: Cigarettes    Quit date: 06/17/2014  . Smokeless tobacco: Not on file  . Alcohol Use: No  . Drug Use: No  . Sexual Activity: Not on file   Other Topics Concern  . None   Social History Narrative      Review of Systems    Constitutional: Positive for fatigue. Negative for fever and chills.  Respiratory: Positive for cough and  shortness of breath.   Cardiovascular: Negative for chest pain.  Gastrointestinal: Negative for nausea, vomiting, abdominal pain and blood in stool.  Genitourinary: Negative for dysuria and hematuria.  Musculoskeletal: Negative for back pain.  Neurological: Positive for weakness.       Occ HA'S    Vital Signs: BP 141/92 mmHg  Pulse 83  Temp(Src) 98.8 F (37.1 C) (Oral)  Resp 20  SpO2 100%  Physical Exam  Constitutional: He is oriented to person, place, and time.  Thin black male in no acute distress  Cardiovascular: Normal rate and regular rhythm.   Pulmonary/Chest: Effort normal and breath sounds normal.  Right Pleurx catheter intact, insertion site okay, gauze dressing in place  Abdominal: Soft. Bowel sounds are normal. There is no tenderness.  Musculoskeletal:  Trace left lower extremity edema; mild left-sided weakness  Neurological: He is alert and oriented to person, place, and time.    Mallampati Score:     Imaging: Ct Chest W Contrast  10/16/2014   CLINICAL DATA:  59 year old male with stage IV non-small-cell lung cancer diagnosed in April 2016, presenting for restaging.  EXAM: CT CHEST WITH CONTRAST  TECHNIQUE: Multidetector CT imaging of the chest was performed during intravenous contrast administration.  CONTRAST:  62m OMNIPAQUE IOHEXOL 300 MG/ML  SOLN  COMPARISON:  08/04/2014 chest radiograph.  06/18/2014 chest CT.  FINDINGS: Mediastinum/Nodes: Normal heart size. There is mild pericardial fluid/ thickening, slightly decreased since 08/12/2014 CT abdomen study. There is atherosclerosis of the thoracic aorta, the great vessels of the mediastinum and the coronary arteries, including calcified atherosclerotic plaque in the left anterior descending, left circumflex and right coronary arteries. Great vessels are normal in course and caliber. No central pulmonary emboli. Normal visualized thyroid. Normal esophagus. No axillary lymphadenopathy. There is a 1.6 cm short axis  subcarinal node (2/28), decreased from 2.7 cm. No pathologically enlarged left hilar nodes.  Lungs/Pleura: Right-sided chest tube enters the right pleural space anteriorly between the fifth and sixth ribs, and terminates in the posterior basilar right pleural space. There is a trace residual right pleural effusion, which is decreased compared to the 08/12/2014 CT study. No left pleural effusion. No pneumothorax. There is an 8.6 x 4.8 cm central right upper lobe lung mass (3/26), which is decreased from 9.4 x 7.4 cm on 06/18/14. The right upper lobe mass demonstrates stable direct invasion of the anterior mediastinum (a 4.6 x 2.7 cm anterior mediastinal tumor component on series 2/image 20 previously measured 4.7 x 2.8 cm on 06/18/14, not appreciably changed) and increased direct invasion of the right superior pulmonary vein and right anterior aspect of the left atrium (a 5.0 x 3.2 cm tumor component on series 2/images 30 previously measured 4.8 x 3.0 cm,  slightly increased with increased extension into the left atrium). There are stable subpleural pulmonary nodules in the left upper lobe measuring 7 mm (3/27) and 5 mm (3/29). A subpleural 4 mm posterior left lower lobe pulmonary nodule (3/30) is decreased from 7 mm on 08/12/2014. A 3 mm subpleural posterior left lower lobe pulmonary nodule (3/39) is decreased from 11 mm. The 2 additional subcentimeter left lower lobe pulmonary nodules visualized on the 08/12/2014 CT abdomen study have resolved. There are no new pulmonary nodules. Re- demonstrated is prominent paraseptal emphysema, upper lobe predominant, severe at the left lung apex.  Upper abdomen: Hypodense 0.4 cm and 0.5 cm right liver lobe lesions (2/47 and 2/50) are too small to characterize, which were not definitely present on the prior CT studies. Simple 1.5 cm upper left renal cyst. Moderate degenerative changes in the thoracic spine.  Musculoskeletal: There are enlarging right posterior first rib, right  posterior third rib and right manubrial lytic metastases, with associated subacute nondisplaced pathologic fracture of the right posterior first rib.  IMPRESSION: 1. Mildly decreased primary malignancy in the central right upper lobe, now 8.6 x 4.8 cm. Stable direct invasion of the anterior mediastinum by the right upper lobe mass. Slightly increased direct invasion of the right superior pulmonary vein and left atrium by the right upper lobe mass. 2. Trace residual right pleural effusion with right chest tube in place. Mild pericardial fluid/thickening, slightly decreased. 3. Partial treatment response in the subcarinal mediastinal lymphadenopathy and in the left lower lobe pulmonary nodules. 4. 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   Electronically Signed   By: Ilona Sorrel M.D.   On: 10/16/2014 16:28    Labs:  CBC:  Recent Labs  09/12/14 0938 09/19/14 1150 09/26/14 0922 10/10/14 0955  WBC 7.1 5.6 4.9 6.0  HGB 10.6* 9.9* 9.0* 9.8*  HCT 34.5* 32.7* 29.4* 32.3*  PLT 474* 545* 456* 443*    COAGS:  Recent Labs  08/04/14 1026  INR 1.18  APTT 36    BMP:  Recent Labs  09/12/14 0938 09/19/14 1150 09/26/14 0922 10/10/14 0955 10/16/14 1409  NA 135 136 136 138  --   K 3.9 4.2 4.1 4.5  --   CL 99* 102 103 107  --   CO2 '27 27 27 27  '$ --   GLUCOSE 125* 104* 91 116*  --   BUN '12 15 14 13  '$ --   CALCIUM 8.3* 8.1* 8.0* 8.1*  --   CREATININE 0.68 0.80 0.88 0.83 0.70  GFRNONAA >60 >60 >60 >60  --   GFRAA >60 >60 >60 >60  --     LIVER FUNCTION TESTS:  Recent Labs  09/12/14 0938 09/19/14 1150 09/26/14 0922 10/10/14 0955  BILITOT 0.8 0.5 0.5 0.4  AST 33 35 35 36  ALT '23 26 29 28  '$ ALKPHOS 163* 134* 130* 149*  PROT 9.1* 8.4* 7.9 8.0  ALBUMIN 2.2* 2.1* 2.1* 2.1*    TUMOR MARKERS: No results for input(s): AFPTM, CEA, CA199, CHROMGRNA  in the last 8760 hours.  Assessment and Plan:  Patient with history of stage IV non-small cell lung cancer and prior right Pleurx catheter placement at [facility] 06/2014 for associated effusion. Recent CT chest reveals only trace effusion and patient has had minimal drainage from catheter since May of this year. He presents today for right Pleurx catheter removal. Details/risks of procedure,  including but not limited to, internal bleeding, infection, injury to adjacent structures, discussed with patient and wife with their understanding and consent.  Thank you for this interesting consult.  I greatly enjoyed meeting Jaiyden Snell and look forward to participating in their care.  A copy of this report was sent to the requesting provider on this date.  Signed: D. Rowe Robert 10/20/2014, 10:43 AM   I spent a total of 25 minutes in face to face in clinical consultation, greater than 50% of which was counseling/coordinating care for right Pleurx catheter removal

## 2014-10-20 NOTE — Discharge Instructions (Signed)
Conscious Sedation, Adult, Care After Refer to this sheet in the next few weeks. These instructions provide you with information on caring for yourself after your procedure. Your health care provider may also give you more specific instructions. Your treatment has been planned according to current medical practices, but problems sometimes occur. Call your health care provider if you have any problems or questions after your procedure. WHAT TO EXPECT AFTER THE PROCEDURE  After your procedure:  You may feel sleepy, clumsy, and have poor balance for several hours.  Vomiting may occur if you eat too soon after the procedure. HOME CARE INSTRUCTIONS  Do not participate in any activities where you could become injured for at least 24 hours. Do not:  Drive.  Swim.  Ride a bicycle.  Operate heavy machinery.  Cook.  Use power tools.  Climb ladders.  Work from a high place.  Do not make important decisions or sign legal documents until you are improved.  If you vomit, drink water, juice, or soup when you can drink without vomiting. Make sure you have little or no nausea before eating solid foods.  Only take over-the-counter or prescription medicines for pain, discomfort, or fever as directed by your health care provider.  Make sure you and your family fully understand everything about the medicines given to you, including what side effects may occur.  You should not drink alcohol, take sleeping pills, or take medicines that cause drowsiness for at least 24 hours.  If you smoke, do not smoke without supervision.  If you are feeling better, you may resume normal activities 24 hours after you were sedated.  Keep all appointments with your health care provider. SEEK MEDICAL CARE IF:  Your skin is pale or bluish in color.  You continue to feel nauseous or vomit.  Your pain is getting worse and is not helped by medicine.  You have bleeding or swelling.  You are still sleepy or  feeling clumsy after 24 hours. SEEK IMMEDIATE MEDICAL CARE IF:  You develop a rash.  You have difficulty breathing.  You develop any type of allergic problem.  You have a fever. MAKE SURE YOU:  Understand these instructions.  Will watch your condition.  Will get help right away if you are not doing well or get worse. Document Released: 12/19/2012 Document Reviewed: 12/19/2012 [facility] Patient Information 2015 [place], [place]. This information is not intended to replace advice given to you by your health care provider. Make sure you discuss any questions you have with your health care provider.  Incision Care  An incision is a surgical cut to open your skin. You need to take care of your incision. This helps you to not get an infection. HOME CARE  Only take medicine as told by your doctor.  Do not take off your bandage (dressing) or get your incision wet until your doctor approves. Change the bandage and call your doctor if the bandage gets wet, dirty, or starts to smell.  Take showers. Do not take baths, swim, or do anything that may soak your incision until it heals.  Return to your normal diet and activities as told or allowed by your doctor.  Avoid lifting any weight until your doctor approves.  Put medicine that helps lessen itching on your incision as told by your doctor. Do not pick or scratch at your incision.  Keep your doctor visit to have your stitches (sutures) or staples removed.  Drink enough fluids to keep your pee (urine) clear or  pale yellow. GET HELP RIGHT AWAY IF:  You have a fever.  You have a rash.  You are dizzy, or you pass out (faint) while standing.  You have trouble breathing.  You have a reaction or side effects to medicine given to you.  You have redness, puffiness (swelling), or more pain in the incision and medicine does not help.  You have fluid, blood, or yellowish-white fluid (pus) coming from the incision lasting over 1 day.  You  have muscle aches, chills, or you feel sick.  You have a bad smell coming from the incision or bandage.  Your incision opens up after stitches, staples, or sticky strips have been removed.  You keep feeling sick to your stomach (nauseous) or keep throwing up (vomiting). MAKE SURE YOU:   Understand these instructions.  Will watch your condition.  Will get help right away if you are not doing well or get worse. Document Released: 05/23/2011 Document Reviewed: 04/24/2013 [facility] Patient Information 2015 [place]. This information is not intended to replace advice given to you by your health care provider. Make sure you discuss any questions you have with your health care provider.

## 2014-11-07 ENCOUNTER — Encounter (HOSPITAL_COMMUNITY): Payer: Self-pay | Admitting: Hematology & Oncology

## 2014-11-07 ENCOUNTER — Ambulatory Visit (HOSPITAL_COMMUNITY): Payer: Medicare Other | Admitting: Hematology & Oncology

## 2014-11-14 ENCOUNTER — Encounter (HOSPITAL_BASED_OUTPATIENT_CLINIC_OR_DEPARTMENT_OTHER): Payer: Medicare Other | Admitting: Oncology

## 2014-11-14 ENCOUNTER — Encounter (HOSPITAL_COMMUNITY): Payer: Medicare Other | Attending: Oncology

## 2014-11-14 ENCOUNTER — Encounter (HOSPITAL_COMMUNITY): Payer: Self-pay | Admitting: Oncology

## 2014-11-14 VITALS — BP 121/84 | HR 82 | Temp 98.7°F | Resp 20 | Wt 162.6 lb

## 2014-11-14 DIAGNOSIS — R918 Other nonspecific abnormal finding of lung field: Secondary | ICD-10-CM | POA: Diagnosis present

## 2014-11-14 DIAGNOSIS — C801 Malignant (primary) neoplasm, unspecified: Secondary | ICD-10-CM | POA: Diagnosis not present

## 2014-11-14 DIAGNOSIS — C7801 Secondary malignant neoplasm of right lung: Secondary | ICD-10-CM

## 2014-11-14 DIAGNOSIS — C3411 Malignant neoplasm of upper lobe, right bronchus or lung: Secondary | ICD-10-CM

## 2014-11-14 LAB — CBC WITH DIFFERENTIAL/PLATELET
BASOS PCT: 0 % (ref 0–1)
Basophils Absolute: 0 10*3/uL (ref 0.0–0.1)
Eosinophils Absolute: 0.1 10*3/uL (ref 0.0–0.7)
Eosinophils Relative: 2 % (ref 0–5)
HEMATOCRIT: 32.3 % — AB (ref 39.0–52.0)
HEMOGLOBIN: 9.9 g/dL — AB (ref 13.0–17.0)
LYMPHS PCT: 13 % (ref 12–46)
Lymphs Abs: 0.7 10*3/uL (ref 0.7–4.0)
MCH: 24.1 pg — ABNORMAL LOW (ref 26.0–34.0)
MCHC: 30.7 g/dL (ref 30.0–36.0)
MCV: 78.6 fL (ref 78.0–100.0)
MONO ABS: 0.8 10*3/uL (ref 0.1–1.0)
MONOS PCT: 14 % — AB (ref 3–12)
NEUTROS ABS: 4.1 10*3/uL (ref 1.7–7.7)
NEUTROS PCT: 71 % (ref 43–77)
Platelets: 567 10*3/uL — ABNORMAL HIGH (ref 150–400)
RBC: 4.11 MIL/uL — ABNORMAL LOW (ref 4.22–5.81)
RDW: 16 % — ABNORMAL HIGH (ref 11.5–15.5)
WBC: 5.8 10*3/uL (ref 4.0–10.5)

## 2014-11-14 LAB — COMPREHENSIVE METABOLIC PANEL
ALK PHOS: 139 U/L — AB (ref 38–126)
ALT: 25 U/L (ref 17–63)
AST: 32 U/L (ref 15–41)
Albumin: 2 g/dL — ABNORMAL LOW (ref 3.5–5.0)
Anion gap: 5 (ref 5–15)
BILIRUBIN TOTAL: 0.5 mg/dL (ref 0.3–1.2)
BUN: 15 mg/dL (ref 6–20)
CALCIUM: 8 mg/dL — AB (ref 8.9–10.3)
CO2: 26 mmol/L (ref 22–32)
Chloride: 104 mmol/L (ref 101–111)
Creatinine, Ser: 0.75 mg/dL (ref 0.61–1.24)
GFR calc Af Amer: >60 mL/min (ref >60)
Glucose, Bld: 101 mg/dL — ABNORMAL HIGH (ref 65–99)
Potassium: 4.1 mmol/L (ref 3.5–5.1)
Sodium: 135 mmol/L (ref 135–145)
TOTAL PROTEIN: 8.3 g/dL — AB (ref 6.5–8.1)

## 2014-11-14 MED ORDER — HYDROCODONE-ACETAMINOPHEN 10-325 MG PO TABS: 1.0000 | ORAL_TABLET | Freq: Four times a day (QID) | ORAL | Status: DC | PRN

## 2014-11-14 MED ORDER — MORPHINE SULFATE ER 60 MG PO TBCR
60.0000 mg | EXTENDED_RELEASE_TABLET | Freq: Two times a day (BID) | ORAL | Status: DC
Start: 2014-11-14 — End: 2014-12-15

## 2014-11-14 NOTE — Assessment & Plan Note (Addendum)
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  Therefore, he was started on Xalkori and he started this medication on 09/03/2014.  Labs today: CBC diff, CMET  Labs in 2.5 weeks: CBC diff, CMET  Repeat CT of chest as scheduled for restaging on 9/19.  His weight is down 3 lbs, but stable compared to July 2016.  He notes that he has an appetite, but taste of foods is poor.  I have asked him to eat anyway to help with his weight as we do not like to see significant weight loss.  Spirits are good and he is laughing and joking with me today.  He feels well without any other complaints.  His pain is well controlled.  MS Contin and Hydrocodone Rxs are refilled today.  Return in 2.5 weeks for follow-up after CT scan

## 2014-11-14 NOTE — Progress Notes (Signed)
[name], MD [facility] [address] [place] [address]  Malignant neoplasm of upper lobe of right lung - Plan: morphine (MS CONTIN) 60 MG 12 hr tablet, HYDROcodone-acetaminophen (NORCO) 10-325 MG per tablet, CBC with Differential, Comprehensive metabolic panel  CURRENT THERAPY: Xalkori on 09/03/2014  INTERVAL HISTORY: Bradley Molina 59 y.o. male returns for followup of right upper lobe mass with right pleural effusion, initially referred to Hospice, but reporting to [facility] for second opinion regarding possible treatment options.  He is S/P palliative radiation from 6/2- 08/27/2014.  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    Lung cancer   08/04/2014 Pathology Results Diagnosis Lung, needle/core biopsy(ies), right upper lobe apical mass - POSITIVE FOR POORLY DIFFERENTIATED CARCINOMA.   08/07/2014 Treatment Plan Change 2 unit PRBC   08/14/2014 - 08/27/2014 Radiation Therapy Right upper lobe of lung to 30 Gy at 3 Gy/fraction x 10 fractions.  Dr. Pablo Ledger.   08/27/2014 Pathology Results [facility]- Alternations noted: MET amo, CCND1 amp, EMSY amp, FGF19 amp, FGF 3 amp, FGF 4 amp, PBRMI R948f*82, PIK3GG amp -equivocal, TP53 R273L.   09/03/2014 -  Chemotherapy Xalkori for MET amplification   10/17/2014 Imaging CT chest-  Mildly decreased primary malignancy in the central right upper lobe, now 8.6 x 4.8 cm. Stable direct invasion of the anterior mediastinum by the right upper lobe mass. Slightly increased direct invasion of the right superior pulmonary vei...   10/20/2014 Procedure PleurX cath removed by IR.   I personally reviewed and went over laboratory results with the patient.  The results are noted within this dictation.  His weight is is down 3 lbs, but stable compared to July 2016.  He notes that he has a strong appetite.  He enjoys the way food smells and looks, but his taste for foods  is off.  I have asked him to eat anyway.  He otherwise feels well.  He denies any cough, SOB, Dyspnea, hemoptysis.  He is joking with me and in good spirits.   Past Medical History  Diagnosis Date  . Lung cancer   . Arthritis   . Hypertension   . Allergy   . COPD (chronic obstructive pulmonary disease)   . Diabetes mellitus without complication   . Stroke   . Hyperlipidemia     has Lung cancer and Malignant neoplasm of upper lobe of right lung on his problem list.     is allergic to penicillins.  Current Outpatient Prescriptions on File Prior to Visit  Medication Sig Dispense Refill  . Alum & Mag Hydroxide-Simeth (MAGIC MOUTHWASH W/LIDOCAINE) SOLN Swish and swallow 549m(1 teaspoonful) every 4 hours. (Patient taking differently: Take 5 mLs by mouth 4 (four) times daily as needed for mouth pain. ) 360 mL 1  . ammonium lactate (AMLACTIN) 12 % cream Apply topically as needed for dry skin.    . Marland Kitchenspirin 81 MG tablet Take 162 mg by mouth 2 (two) times daily.     . crizotinib (XALKORI) 250 MG capsule Take 1 capsule (250 mg total) by mouth 2 (two) times daily. 60 capsule 3  . docusate sodium (EQUATE STOOL SOFTENER) 100 MG capsule Take 100 mg by mouth 2 (two) times daily as needed for mild constipation.     . magnesium hydroxide (MILK OF MAGNESIA) 800 MG/5ML suspension Take 15 mLs by mouth 2 (two) times daily as needed for constipation.    .Marland Kitchen  metFORMIN (GLUCOPHAGE) 500 MG tablet Take 500 mg by mouth 2 (two) times daily with a meal.    . ondansetron (ZOFRAN) 8 MG tablet Take 8 mg by mouth every 8 (eight) hours as needed for nausea or vomiting.     . polyethylene glycol (MIRALAX / GLYCOLAX) packet Take 17 g by mouth daily as needed for mild constipation.     . potassium chloride SA (K-DUR,KLOR-CON) 20 MEQ tablet Take 2 tablets (40 mEq total) by mouth 2 (two) times daily. 60 tablet 0  . prochlorperazine (COMPAZINE) 10 MG tablet Take 10 mg by mouth every 6 (six) hours as needed for nausea or  vomiting.      No current facility-administered medications on file prior to visit.    Past Surgical History  Procedure Laterality Date  . Pleurx catheter      right side  . Peripherally inserted central catheter insertion Right 08/2014    Denies any headaches, dizziness, double vision, fevers, chills, night sweats, nausea, vomiting, diarrhea, constipation, chest pain, heart palpitations, shortness of breath, blood in stool, black tarry stool, urinary pain, urinary burning, urinary frequency, hematuria.   PHYSICAL EXAMINATION  ECOG PERFORMANCE STATUS: 1 - Symptomatic but completely ambulatory  Filed Vitals:   11/14/14 1200  BP: 121/84  Pulse: 82  Temp: 98.7 F (37.1 C)  Resp: 20    GENERAL:alert, no distress, well developed, cachectic, comfortable, cooperative, smiling and accompanied by wife. SKIN: skin color, texture, turgor are normal, no rashes or significant lesions HEAD: Normocephalic, No masses, lesions, tenderness or abnormalities EYES: normal, PERRLA EARS: External ears normal OROPHARYNX:lips, buccal mucosa, and tongue normal and mucous membranes are moist  NECK: supple, no adenopathy, thyroid normal size, non-tender, without nodularity, trachea midline LYMPH:  no palpable lymphadenopathy BREAST:not examined LUNGS: clear to auscultation  HEART: regular rate & rhythm, no murmurs and no gallops ABDOMEN:abdomen soft and normal bowel sounds BACK: Back symmetric, no curvature., No CVA tenderness EXTREMITIES:less then 2 second capillary refill, no joint deformities, effusion, or inflammation, no skin discoloration, no cyanosis  NEURO: alert & oriented x 3 with fluent speech, no focal motor/sensory deficits, using a rolling walker to help with ambulation.   LABORATORY DATA: CBC    Component Value Date/Time   WBC 6.0 10/10/2014 0955   RBC 4.06* 10/10/2014 0955   HGB 9.8* 10/10/2014 0955   HCT 32.3* 10/10/2014 0955   PLT 443* 10/10/2014 0955   MCV 79.6 10/10/2014  0955   MCH 24.1* 10/10/2014 0955   MCHC 30.3 10/10/2014 0955   RDW 19.1* 10/10/2014 0955   LYMPHSABS 0.9 10/10/2014 0955   MONOABS 0.7 10/10/2014 0955   EOSABS 0.3 10/10/2014 0955   BASOSABS 0.0 10/10/2014 0955      Chemistry      Component Value Date/Time   NA 138 10/10/2014 0955   K 4.5 10/10/2014 0955   CL 107 10/10/2014 0955   CO2 27 10/10/2014 0955   BUN 13 10/10/2014 0955   CREATININE 0.70 10/16/2014 1409      Component Value Date/Time   CALCIUM 8.1* 10/10/2014 0955   ALKPHOS 149* 10/10/2014 0955   AST 36 10/10/2014 0955   ALT 28 10/10/2014 0955   BILITOT 0.4 10/10/2014 0955     RADIOLOGY:  CLINICAL DATA: 59 year old male with stage IV non-small-cell lung cancer diagnosed in April 2016, presenting for restaging.  EXAM: CT CHEST WITH CONTRAST  TECHNIQUE: Multidetector CT imaging of the chest was performed during intravenous contrast administration.  CONTRAST: 27m OMNIPAQUE IOHEXOL 300 MG/ML  SOLN  COMPARISON: 08/04/2014 chest radiograph. 06/18/2014 chest CT.  FINDINGS: Mediastinum/Nodes: Normal heart size. There is mild pericardial fluid/ thickening, slightly decreased since 08/12/2014 CT abdomen study. There is atherosclerosis of the thoracic aorta, the great vessels of the mediastinum and the coronary arteries, including calcified atherosclerotic plaque in the left anterior descending, left circumflex and right coronary arteries. Great vessels are normal in course and caliber. No central pulmonary emboli. Normal visualized thyroid. Normal esophagus. No axillary lymphadenopathy. There is a 1.6 cm short axis subcarinal node (2/28), decreased from 2.7 cm. No pathologically enlarged left hilar nodes.  Lungs/Pleura: Right-sided chest tube enters the right pleural space anteriorly between the fifth and sixth ribs, and terminates in the posterior basilar right pleural space. There is a trace residual right pleural effusion, which is decreased  compared to the 08/12/2014 CT study. No left pleural effusion. No pneumothorax. There is an 8.6 x 4.8 cm central right upper lobe lung mass (3/26), which is decreased from 9.4 x 7.4 cm on 06/18/14. The right upper lobe mass demonstrates stable direct invasion of the anterior mediastinum (a 4.6 x 2.7 cm anterior mediastinal tumor component on series 2/image 20 previously measured 4.7 x 2.8 cm on 06/18/14, not appreciably changed) and increased direct invasion of the right superior pulmonary vein and right anterior aspect of the left atrium (a 5.0 x 3.2 cm tumor component on series 2/images 30 previously measured 4.8 x 3.0 cm, slightly increased with increased extension into the left atrium). There are stable subpleural pulmonary nodules in the left upper lobe measuring 7 mm (3/27) and 5 mm (3/29). A subpleural 4 mm posterior left lower lobe pulmonary nodule (3/30) is decreased from 7 mm on 08/12/2014. A 3 mm subpleural posterior left lower lobe pulmonary nodule (3/39) is decreased from 11 mm. The 2 additional subcentimeter left lower lobe pulmonary nodules visualized on the 08/12/2014 CT abdomen study have resolved. There are no new pulmonary nodules. Re- demonstrated is prominent paraseptal emphysema, upper lobe predominant, severe at the left lung apex.  Upper abdomen: Hypodense 0.4 cm and 0.5 cm right liver lobe lesions (2/47 and 2/50) are too small to characterize, which were not definitely present on the prior CT studies. Simple 1.5 cm upper left renal cyst. Moderate degenerative changes in the thoracic spine.  Musculoskeletal: There are enlarging right posterior first rib, right posterior third rib and right manubrial lytic metastases, with associated subacute nondisplaced pathologic fracture of the right posterior first rib.  IMPRESSION: 1. Mildly decreased primary malignancy in the central right upper lobe, now 8.6 x 4.8 cm. Stable direct invasion of the anterior mediastinum  by the right upper lobe mass. Slightly increased direct invasion of the right superior pulmonary vein and left atrium by the right upper lobe mass. 2. Trace residual right pleural effusion with right chest tube in place. Mild pericardial fluid/thickening, slightly decreased. 3. Partial treatment response in the subcarinal mediastinal lymphadenopathy and in the left lower lobe pulmonary nodules. 4. 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   Electronically Signed  By: Ilona Sorrel M.D.  On: 10/16/2014 16:28    ASSESSMENT AND PLAN:  Lung cancer Malignancy of unknown primary right a right upper lobe mass and right pleural effusion, radiographically indicative of bronchogenic primary.  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  [facility] testing reported on 08/27/2014 demonstrating a  MET amplification.  Therefore, he was started on Xalkori and he started this medication on 09/03/2014.  Labs today: CBC diff, CMET  Labs in 2.5 weeks: CBC diff, CMET  Repeat CT of chest as scheduled for restaging on 9/19.  His weight is down 3 lbs, but stable compared to July 2016.  He notes that he has an appetite, but taste of foods is poor.  I have asked him to eat anyway to help with his weight as we do not like to see significant weight loss.  Spirits are good and he is laughing and joking with me today.  He feels well without any other complaints.  His pain is well controlled.  MS Contin and Hydrocodone Rxs are refilled today.  Return in 2.5 weeks for follow-up after CT scan   THERAPY PLAN:  Continue Xalkori as prescribed. We will restage with CT as scheduled on 9/19 for restaging.  All questions were answered. The patient knows to call the clinic with any problems, questions or concerns. We  can certainly see the patient much sooner if necessary.  Patient and plan discussed with Dr. Ancil Linsey and she is in agreement with the aforementioned.   This note is electronically signed by: Doy Mince 11/14/2014 12:46 PM

## 2014-11-14 NOTE — Patient Instructions (Signed)
..  [place] at [facility] Discharge Instructions  RECOMMENDATIONS MADE BY THE CONSULTANT AND ANY TEST RESULTS WILL BE SENT TO YOUR REFERRING PHYSICIAN.  Seen and discussion with Robynn Pane  PA-C. Call the cancer center with any questions and/or concerns that you have. Labs today- CBC with diff, CMET CT scan as scheduled 12/01/14. Labs in 2.5 weeks. Return in 2.5 weeks to review scan results with Dr. Whitney Muse.       Thank you for choosing [place] at [facility] to provide your oncology and hematology care.  To afford each patient quality time with our provider, please arrive at least 15 minutes before your scheduled appointment time.    You need to re-schedule your appointment should you arrive 10 or more minutes late.  We strive to give you quality time with our providers, and arriving late affects you and other patients whose appointments are after yours.  Also, if you no show three or more times for appointments you may be dismissed from the clinic at the providers discretion.     Again, thank you for choosing [facility].  Our hope is that these requests will decrease the amount of time that you wait before being seen by our physicians.       _____________________________________________________________  Should you have questions after your visit to [facility], please contact our office at [phone] between the hours of 8:30 a.m. and 4:30 p.m.  Voicemails left after 4:30 p.m. will not be returned until the following business day.  For prescription refill requests, have your pharmacy contact our office.

## 2014-12-01 ENCOUNTER — Ambulatory Visit (HOSPITAL_COMMUNITY)
Admission: RE | Admit: 2014-12-01 | Discharge: 2014-12-01 | Disposition: A | Discharge status: Home / Self Care | Payer: Medicare Other | Source: Ambulatory Visit | Attending: Oncology | Admitting: Oncology

## 2014-12-01 DIAGNOSIS — Z08 Encounter for follow-up examination after completed treatment for malignant neoplasm: Secondary | ICD-10-CM | POA: Insufficient documentation

## 2014-12-01 DIAGNOSIS — C3411 Malignant neoplasm of upper lobe, right bronchus or lung: Secondary | ICD-10-CM

## 2014-12-01 DIAGNOSIS — R918 Other nonspecific abnormal finding of lung field: Secondary | ICD-10-CM | POA: Insufficient documentation

## 2014-12-01 DIAGNOSIS — Z923 Personal history of irradiation: Secondary | ICD-10-CM | POA: Diagnosis not present

## 2014-12-01 DIAGNOSIS — Z87891 Personal history of nicotine dependence: Secondary | ICD-10-CM | POA: Insufficient documentation

## 2014-12-01 DIAGNOSIS — C3491 Malignant neoplasm of unspecified part of right bronchus or lung: Secondary | ICD-10-CM | POA: Insufficient documentation

## 2014-12-01 DIAGNOSIS — Z9221 Personal history of antineoplastic chemotherapy: Secondary | ICD-10-CM | POA: Insufficient documentation

## 2014-12-01 MED ORDER — IOHEXOL 300 MG/ML  SOLN
75.0000 mL | Freq: Once | INTRAMUSCULAR | Status: AC | PRN
  Administered 2014-12-01: 75 mL via INTRAVENOUS

## 2014-12-02 ENCOUNTER — Encounter (HOSPITAL_COMMUNITY): Payer: Self-pay | Admitting: Hematology & Oncology

## 2014-12-02 ENCOUNTER — Other Ambulatory Visit (HOSPITAL_COMMUNITY)
Admission: RE | Admit: 2014-12-02 | Discharge: 2014-12-02 | Disposition: A | Discharge status: Home / Self Care | Payer: Medicare Other | Source: Ambulatory Visit | Attending: Hematology & Oncology | Admitting: Hematology & Oncology

## 2014-12-02 ENCOUNTER — Encounter (HOSPITAL_COMMUNITY): Payer: Self-pay | Admitting: *Deleted

## 2014-12-02 ENCOUNTER — Encounter (HOSPITAL_BASED_OUTPATIENT_CLINIC_OR_DEPARTMENT_OTHER): Payer: Medicare Other | Admitting: Hematology & Oncology

## 2014-12-02 ENCOUNTER — Encounter (HOSPITAL_BASED_OUTPATIENT_CLINIC_OR_DEPARTMENT_OTHER): Payer: Medicare Other

## 2014-12-02 ENCOUNTER — Telehealth (HOSPITAL_COMMUNITY): Payer: Self-pay | Admitting: *Deleted

## 2014-12-02 VITALS — BP 121/73 | HR 77 | Temp 98.6°F | Resp 20 | Wt 165.0 lb

## 2014-12-02 DIAGNOSIS — C7801 Secondary malignant neoplasm of right lung: Secondary | ICD-10-CM

## 2014-12-02 DIAGNOSIS — C3411 Malignant neoplasm of upper lobe, right bronchus or lung: Secondary | ICD-10-CM

## 2014-12-02 DIAGNOSIS — I639 Cerebral infarction, unspecified: Secondary | ICD-10-CM

## 2014-12-02 DIAGNOSIS — C801 Malignant (primary) neoplasm, unspecified: Secondary | ICD-10-CM | POA: Diagnosis not present

## 2014-12-02 DIAGNOSIS — R918 Other nonspecific abnormal finding of lung field: Secondary | ICD-10-CM | POA: Diagnosis not present

## 2014-12-02 LAB — CBC WITH DIFFERENTIAL/PLATELET
BASOS ABS: 0 10*3/uL (ref 0.0–0.1)
Basophils Relative: 0 %
EOS ABS: 0.2 10*3/uL (ref 0.0–0.7)
Eosinophils Relative: 3 %
HCT: 28.9 % — ABNORMAL LOW (ref 39.0–52.0)
Hemoglobin: 8.9 g/dL — ABNORMAL LOW (ref 13.0–17.0)
Lymphocytes Relative: 13 %
Lymphs Abs: 0.8 10*3/uL (ref 0.7–4.0)
MCH: 24.3 pg — ABNORMAL LOW (ref 26.0–34.0)
MCHC: 30.8 g/dL (ref 30.0–36.0)
MCV: 79 fL (ref 78.0–100.0)
MONO ABS: 1 10*3/uL (ref 0.1–1.0)
Monocytes Relative: 16 %
NEUTROS ABS: 4.2 10*3/uL (ref 1.7–7.7)
Neutrophils Relative %: 68 %
Platelets: 476 10*3/uL — ABNORMAL HIGH (ref 150–400)
RBC: 3.66 MIL/uL — ABNORMAL LOW (ref 4.22–5.81)
RDW: 15.8 % — AB (ref 11.5–15.5)
WBC Morphology: INCREASED
WBC: 6.2 10*3/uL (ref 4.0–10.5)

## 2014-12-02 LAB — COMPREHENSIVE METABOLIC PANEL
ALBUMIN: 1.7 g/dL — AB (ref 3.5–5.0)
ALT: 17 U/L (ref 17–63)
ANION GAP: 3 — AB (ref 5–15)
AST: 25 U/L (ref 15–41)
Alkaline Phosphatase: 129 U/L — ABNORMAL HIGH (ref 38–126)
BUN: 12 mg/dL (ref 6–20)
CO2: 27 mmol/L (ref 22–32)
Calcium: 7.9 mg/dL — ABNORMAL LOW (ref 8.9–10.3)
Chloride: 108 mmol/L (ref 101–111)
Creatinine, Ser: 0.68 mg/dL (ref 0.61–1.24)
GFR calc Af Amer: >60 mL/min (ref >60)
GFR calc non Af Amer: >60 mL/min (ref >60)
GLUCOSE: 134 mg/dL — AB (ref 65–99)
POTASSIUM: 4.1 mmol/L (ref 3.5–5.1)
Sodium: 138 mmol/L (ref 135–145)
TOTAL PROTEIN: 7.6 g/dL (ref 6.5–8.1)
Total Bilirubin: 0.4 mg/dL (ref 0.3–1.2)

## 2014-12-02 NOTE — Patient Instructions (Addendum)
[place] at [facility] Discharge Instructions  RECOMMENDATIONS MADE BY THE CONSULTANT AND ANY TEST RESULTS WILL BE SENT TO YOUR REFERRING PHYSICIAN.  Chemotherapy teaching with Hildred Alamin as planned. Start Opdivo (nivolumab) as scheduled.    Thank you for choosing [place] at [facility] to provide your oncology and hematology care.  To afford each patient quality time with our Mandrell Vangilder, please arrive at least 15 minutes before your scheduled appointment time.    You need to re-schedule your appointment should you arrive 10 or more minutes late.  We strive to give you quality time with our providers, and arriving late affects you and other patients whose appointments are after yours.  Also, if you no show three or more times for appointments you may be dismissed from the clinic at the providers discretion.     Again, thank you for choosing [facility].  Our hope is that these requests will decrease the amount of time that you wait before being seen by our physicians.       _____________________________________________________________  Should you have questions after your visit to [facility], please contact our office at [phone] between the hours of 8:30 a.m. and 4:30 p.m.  Voicemails left after 4:30 p.m. will not be returned until the following business day.  For prescription refill requests, have your pharmacy contact our office.   Nivolumab injection What is this medicine? NIVOLUMAB (nye VOL ue mab) is used to treat certain types of melanoma and lung cancer. This medicine may be used for other purposes; ask your health care Amauri Medellin or pharmacist if you have questions. COMMON BRAND NAME(S): Opdivo What should I tell my health care Lakenya Riendeau before I take this medicine? They need to know if you have any of these conditions: -eye disease, vision problems -history of pancreatitis -immune system  problems -inflammatory bowel disease -kidney disease -liver disease -lung disease -lupus -myasthenia gravis -multiple sclerosis -organ transplant -stomach or intestine problems -thyroid disease -tingling of the fingers or toes, or other nerve disorder -an unusual or allergic reaction to nivolumab, other medicines, foods, dyes, or preservatives -pregnant or trying to get pregnant -breast-feeding How should I use this medicine? This medicine is for infusion into a vein. It is given by a health care professional in a hospital or clinic setting. A special MedGuide will be given to you before each treatment. Be sure to read this information carefully each time. Talk to your pediatrician regarding the use of this medicine in children. Special care may be needed. Overdosage: If you think you've taken too much of this medicine contact a poison control center or emergency room at once. Overdosage: If you think you have taken too much of this medicine contact a poison control center or emergency room at once. NOTE: This medicine is only for you. Do not share this medicine with others. What if I miss a dose? It is important not to miss your dose. Call your doctor or health care professional if you are unable to keep an appointment. What may interact with this medicine? Interactions have not been studied. This list may not describe all possible interactions. Give your health care Paulette Rockford a list of all the medicines, herbs, non-prescription drugs, or dietary supplements you use. Also tell them if you smoke, drink alcohol, or use illegal drugs. Some items may interact with your medicine. What should I watch for while using this medicine? Tell your doctor or healthcare professional  if your symptoms do not start to get better or if they get worse. Your condition will be monitored carefully while you are receiving this medicine. You may need blood work done while you are taking this medicine. What side  effects may I notice from receiving this medicine? Side effects that you should report to your doctor or health care professional as soon as possible: -allergic reactions like skin rash, itching or hives, swelling of the face, lips, or tongue -black, tarry stools -bloody or watery diarrhea -changes in vision -chills -cough -depressed mood -eye pain -feeling anxious -fever -general ill feeling or flu-like symptoms -hair loss -loss of appetite -low blood counts - this medicine may decrease the number of white blood cells, red blood cells and platelets. You may be at increased risk for infections and bleeding -pain, tingling, numbness in the hands or feet -redness, blistering, peeling or loosening of the skin, including inside the mouth -red pinpoint spots on skin -signs of decreased platelets or bleeding - bruising, pinpoint red spots on the skin, black, tarry stools, blood in the urine -signs of decreased red blood cells - unusually weak or tired, feeling faint or lightheaded, falls -signs of infection - fever or chills, cough, sore throat, pain or trouble passing urine -signs and symptoms of a dangerous change in heartbeat or heart rhythm like chest pain; dizziness; fast or irregular heartbeat; palpitations; feeling faint or lightheaded, falls; breathing problems -signs and symptoms of high blood sugar such as dizziness; dry mouth; dry skin; fruity breath; nausea; stomach pain; increased hunger or thirst; increased urination -signs and symptoms of kidney injury like trouble passing urine or change in the amount of urine -signs and symptoms of liver injury like dark yellow or brown urine; general ill feeling or flu-like symptoms; light-colored stools; loss of appetite; nausea; right upper belly pain; unusually weak or tired; yellowing of the eyes or skin -signs and symptoms of increased potassium like muscle weakness; chest pain; or fast, irregular heartbeat -signs and symptoms of low  potassium like muscle cramps or muscle pain; chest pain; dizziness; feeling faint or lightheaded, falls; palpitations; breathing problems; or fast, irregular heartbeat -swelling of the ankles, feet, hands -weight gainSide effects that usually do not require medical attention (report to your doctor or health care professional if they continue or are bothersome): -constipation -general ill feeling or flu-like symptoms -hair loss -loss of appetite -nausea, vomiting This list may not describe all possible side effects. Call your doctor for medical advice about side effects. You may report side effects to FDA at 1-800-FDA-1088. Where should I keep my medicine? This drug is given in a hospital or clinic and will not be stored at home. NOTE: This sheet is a summary. It may not cover all possible information. If you have questions about this medicine, talk to your doctor, pharmacist, or health care Panda Crossin.  2015, [employer]vier/Gold Standard. (2013-05-20 13:18:19)

## 2014-12-02 NOTE — Progress Notes (Signed)
[place] at [facility] Progress Note   CHIEF COMPLAINTS/PURPOSE OF CONSULTATION:  Second opinion on: Lung cancer, no biopsy Recent CVA 06/30/14 Admitted at [facility]. He presented with left sided weakness and slurred speech Anemia Pleurx catheter placement in right lung, malignant plural effusion MET amplification XALKORI  HISTORY OF PRESENTING ILLNESS:  Bradley Molina 59 y.o. male is here for follow up . 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 He continues on [place].  The patient has overall been doing well.  He has been going to church and attempting to eat better.  His wife notes that his mood is predominantly good, but he does have his bad days as well.  The patient notes that his breathing is "up and down" and he sometime feels like he can't breathe.  His pain has improved.  He is using a walker. He does not prefer to use his walker at home. He has fallen as a result once or twice.  He sleeps well and often.    He is here today to review CT imaging.   MEDICAL HISTORY:  Past Medical History  Diagnosis Date  . Lung cancer   . Arthritis   . Hypertension   . Allergy   . COPD (chronic obstructive pulmonary disease)   . Diabetes mellitus without complication   . Stroke   . Hyperlipidemia     SURGICAL HISTORY: Past Surgical History  Procedure Laterality Date  . Pleurx catheter      right side  . Peripherally inserted central catheter insertion Right 08/2014    SOCIAL HISTORY: Social History   Social History  . Marital Status: Married    Spouse Name: N/A  . Number of Children: N/A  . Years of Education: N/A   Occupational History  . Not on file.    Social History Main Topics  . Smoking status: Former Smoker -- 1.00 packs/day for 40 years    Types: Cigarettes    Quit date: 06/17/2014  . Smokeless tobacco: Not on file  . Alcohol Use: No  . Drug Use: No  . Sexual Activity: Not on file   Other Topics Concern  . Not on file   Social History Narrative  Married for 25 years with 3 children and 7 grandchildren He worked with furniture Former smoker; quit last month. Prior 40 to 50 pack year history Former drinker  FAMILY HISTORY: Family History  Problem Relation Age of Onset  . Cancer Mother   . COPD Mother   . Emphysema Father    indicated that his mother is deceased. He indicated that his father is deceased.  Mother was in early 21s when she died of lung cancer. Father died of emphysema. His brother died.  3 living sisters, 2 other sisters died.  ALLERGIES:  is allergic to penicillins.  MEDICATIONS:  Current Outpatient Prescriptions  Medication Sig Dispense Refill  . Alum & Mag Hydroxide-Simeth (MAGIC MOUTHWASH W/LIDOCAINE) SOLN Swish and swallow 43m (1 teaspoonful) every 4 hours. (Patient taking differently: Take 5 mLs by mouth 4 (four) times daily as needed for mouth pain. ) 360 mL 1  . ammonium lactate (AMLACTIN) 12 % cream Apply topically as needed for dry skin.    .Marland Kitchenaspirin 81 MG tablet Take 162 mg by  mouth 2 (two) times daily.     . crizotinib (XALKORI) 250 MG capsule Take 1 capsule (250 mg total) by mouth 2 (two) times daily. 60 capsule 3  . docusate sodium (EQUATE STOOL SOFTENER) 100 MG capsule Take 100 mg by mouth 2 (two) times daily as needed for mild constipation.     Marland Kitchen HYDROcodone-acetaminophen (NORCO) 10-325 MG per tablet Take 1 tablet by mouth every 6 (six) hours as needed for moderate pain. 120 tablet 0  . magnesium hydroxide (MILK OF MAGNESIA) 800 MG/5ML suspension Take 15 mLs by mouth 2 (two) times daily as needed for constipation.    . metFORMIN (GLUCOPHAGE) 500 MG tablet Take 500 mg by mouth 2 (two)  times daily with a meal.    . morphine (MS CONTIN) 60 MG 12 hr tablet Take 1 tablet (60 mg total) by mouth every 12 (twelve) hours. 60 tablet 0  . ondansetron (ZOFRAN) 8 MG tablet Take 8 mg by mouth every 8 (eight) hours as needed for nausea or vomiting.     . polyethylene glycol (MIRALAX / GLYCOLAX) packet Take 17 g by mouth daily as needed for mild constipation.     . potassium chloride SA (K-DUR,KLOR-CON) 20 MEQ tablet Take 2 tablets (40 mEq total) by mouth 2 (two) times daily. 60 tablet 0  . prochlorperazine (COMPAZINE) 10 MG tablet Take 10 mg by mouth every 6 (six) hours as needed for nausea or vomiting.      No current facility-administered medications for this visit.    Review of Systems  Constitutional: Positive for malaise/fatigue. Negative for fever, chills and weight loss.  HENT: Negative for congestion, ear discharge, ear pain, hearing loss, nosebleeds, sore throat and tinnitus.   Eyes: Negative for blurred vision, double vision, photophobia, pain, discharge and redness.  Respiratory: Negative for cough and stridor.   Cardiovascular: Negative for chest pain, palpitations, orthopnea, claudication, leg swelling and PND.  Gastrointestinal: Negative for nausea, vomiting and abdominal pain.  Genitourinary: Negative for dysuria and hematuria.  Musculoskeletal: Positive for joint pain and neck pain. Negative for myalgias.  Skin: Negative for rash.  Neurological: Positive for focal weakness and weakness. Negative for dizziness, tingling, tremors, sensory change, speech change, seizures, loss of consciousness and headaches.       Uses walker  Endo/Heme/Allergies: Negative.   Psychiatric/Behavioral: Negative for depression, suicidal ideas, hallucinations, memory loss and substance abuse. The patient has insomnia.    14 point ROS was done and is otherwise as detailed above or in HPI    PHYSICAL EXAMINATION: ECOG PERFORMANCE STATUS: 2 - Symptomatic, <50% confined to bed  Filed Vitals:    12/02/14 0941  BP: 121/73  Pulse: 77  Temp: 98.6 F (37 C)  Resp: 20   Filed Weights   12/02/14 0941  Weight: 165 lb (74.844 kg)   Physical Exam  Constitutional: He is oriented to person, place, and time. No distress.  thin  HENT:  Head: Normocephalic and atraumatic.  Mouth/Throat: Oropharynx is clear and moist. No oropharyngeal exudate.  Eyes: Conjunctivae are normal. Pupils are equal, round, and reactive to light. Right eye exhibits no discharge. Left eye exhibits no discharge. No scleral icterus.  Neck: Normal range of motion. Neck supple.  Cardiovascular: Normal rate and regular rhythm.   Pulmonary/Chest:  Abdominal: Soft. Bowel sounds are normal. He exhibits no distension and no mass. There is no tenderness. There is no rebound and no guarding.  Musculoskeletal: Normal range of motion.  Lymphadenopathy:    He has no  cervical adenopathy.  Neurological: He is alert and oriented to person, place, and time.  Uses walker, decreased strength R side but mostly in RLE  Skin: Skin is warm and dry. He is not diaphoretic. No erythema.  Psychiatric: Mood, memory, affect and judgment normal.    LABORATORY DATA:  I have reviewed the data as listed Lab Results  Component Value Date   WBC 6.2 12/02/2014   HGB 8.9* 12/02/2014   HCT 28.9* 12/02/2014   MCV 79.0 12/02/2014   PLT 476* 12/02/2014   CMP     Component Value Date/Time   NA 135 11/14/2014 1245   K 4.1 11/14/2014 1245   CL 104 11/14/2014 1245   CO2 26 11/14/2014 1245   GLUCOSE 101* 11/14/2014 1245   BUN 15 11/14/2014 1245   CREATININE 0.75 11/14/2014 1245   CALCIUM 8.0* 11/14/2014 1245   PROT 8.3* 11/14/2014 1245   ALBUMIN 2.0* 11/14/2014 1245   AST 32 11/14/2014 1245   ALT 25 11/14/2014 1245   ALKPHOS 139* 11/14/2014 1245   BILITOT 0.5 11/14/2014 1245   GFRNONAA >60 11/14/2014 1245   GFRAA >60 11/14/2014 1245   RADIOLOGY: CLINICAL DATA: Follow up right-sided lung cancer, restaging. Completed chemotherapy  and radiation. Quit smoking April 2016.  EXAM: CT CHEST WITH CONTRAST  TECHNIQUE: Multidetector CT imaging of the chest was performed during intravenous contrast administration.  CONTRAST: 16m OMNIPAQUE IOHEXOL 300 MG/ML SOLN  COMPARISON: 10/16/2014 CT, chest radiograph 10/20/2014  FINDINGS: Mediastinum/Nodes: Anterior mediastinal mass measures 3.7 x 2.3 cm image 22, decreased from previously.  Heart size is normal. Small pericardial effusion reidentified.  Sub carinal centrally necrotic lymphadenopathy measuring 1.3 cm is smaller, image 33.  Great vessels are normal in caliber. Mild atheromatous aortic and coronary arterial calcification.  Lungs/Pleura: Right-sided chest tube has been removed. Right upper lobe mass measuring 7.5 x 5.8 cm image 29 is increased from previously, with increased right hilar and right pulmonary vein erosion/involvement.  Diffuse emphysematous changes are noted. New multilobar irregular patchy areas of airspace opacity with central bronchogram formation are new and increased since previously in the right upper lobe, right middle lobe, and superior segment right lower lobe. Subpleural nodularity is reidentified, dominant 7 mm left lower lobe pulmonary nodule identified image 53, increased from 5 mm previously.  No pneumothorax.  Stable degree of right greater than left pleural fluid and/or thickening.  Upper abdomen: 2 mm too small to characterize hypodense lesion at the dome of the right hepatic lobe image 53 is stable.  Musculoskeletal: Leftward curvature of the thoracolumbar spine partly visualized. No lytic or sclerotic osseous lesion. Multilevel disc degenerative change noted in the spine.  IMPRESSION: Increase in size of dominant right upper lobe paracentral mass compatible with progression of non-small-cell lung cancer, although there is a mixed response with decrease in size of anterior mediastinal mass and  subcarinal lymphadenopathy.  Pulmonary vein and hilar invasion reidentified by tumor.  New multi focal right hemithoracic areas of irregular pulmonary parenchymal opacity, for which differential considerations include metastatic involvement, infection/inflammation, or less likely radiation fibrosis given lack of defined linear contour.   Electronically Signed  By: GConchita ParisM.D.  On: 12/01/2014 14:31   ASSESSMENT & PLAN:  CUP, most consistent with LUNG primary Biopsy on 08/04/2014 c/w poorly differentiated carcinoma Recent CVA 06/30/14 [facility] He presented with left sided weakness and slurred speech Anemia Pleurx catheter placement in right lung, malignant plural effusion MET amplification XALKORI Palliative XRT with good pain control  DNR status  I reviewed his CT imaging.  I have suggested continuing XALKORI while we await Nivolumab approval.  I have also requested testing his tumor for PD-L1.  He is doing fairly well. Pain is markedly improved. He tolerates XALKORI without any problem. He attends church on a regular basis. His major limitations unfortunately have resulted from a CVA. There is no doubt however, that compared to when we first met, he is doing much better.  We addressed end-of-life issues today. The patient and his wife both stated he does not like to discuss it. He is a DO NOT RESUSCITATE. His wife states they have the paperwork for this at home on the refrigerator.  All questions were answered. The patient knows to call the clinic with any problems, questions or concerns.   This note was electronically signed.   This document serves as a record of services personally performed by Ancil Linsey, MD. It was created on her behalf by Janace Hoard, a trained medical scribe. The creation of this record is based on the scribe's personal observations and the provider's statements to them. This document has been checked and  approved by the attending provider.   I have reviewed the above documentation for accuracy and completeness, and I agree with the above.  Kelby Fam. Penland MD

## 2014-12-02 NOTE — Progress Notes (Signed)
LABS DRAWN

## 2014-12-02 NOTE — Progress Notes (Signed)
Not enough tissue to send for PDL1. All of the tissue was exhausted in [employer] study per [place] in Pathology.

## 2014-12-02 NOTE — Telephone Encounter (Signed)
PD1 requested today with Bradley Molina in Pathoogy.

## 2014-12-03 MED ORDER — PREDNISONE 10 MG PO TABS: ORAL_TABLET | ORAL | Status: DC

## 2014-12-03 NOTE — Patient Instructions (Addendum)
[place]   CHEMOTHERAPY INSTRUCTIONS  What is Opdivo?  Opdivo is a medicine that may treat your lung cancer by working with your immune system. Opdivo can cause your immune system to attack normal organs and tissues in many areas of your body and can affect the way they work. These problems can sometimes become serious or life-threatening and can lead to death. These problems may happen anytime during treatment or even after your treatment has ended. Some of these problems may happen more often when OPDIVO is used in combination with YERVOY.  Opdivo is given intravenously over 60 minutes every 14 days.   Most common side effects of Opdivo:  Feeling tired  Pain in muscles, bones, joints  Diarrhea  Rash  Itchy skin  Nausea  During the infusion of Opdivo you could experience a severe infusion reaction and signs/symptoms include:   Chills or shaking, itching or rash, flushing, difficulty breathing, dizziness, fever, feeling like passing out   Call or see your health care provider right away if you notice any of the following:  Lung problems (pneumonitis):  New or worsening cough  Chest pain  Shortness of breath  Intestinal problems (colitis) that can lead to tears or holes in your intestine:  Diarrhea (loose stools) or more bowel movements than usual  Blood in your stools or dark, tarry, sticky stools  Severe stomach area (abdomen) pain or tenderness  Liver problems (hepatitis):  Yellowing or your skin or the whites of your eyes  Severe nausea or vomiting  Pain on the right side of your stomach area  Drowsiness  Dark urine (tea colored)  Bleeding or bruising more easily than normal  Feeling less hungry than usual  Hormone gland problems (especially the thyroid, pituitary, adrenal glands, and pancreas):  Headaches that will not go away or unusual headaches  Extreme tiredness  Weight gain or weight loss  Dizziness  or fainting  Changes in mood or behavior  Hair loss  Feeling cold  Constipation  Voice getting deeper  Excessive thirst or lots of urine  Kidney problems, including nephritis and kidney failure:  Decrease in the amount of urine  Blood in urine  Swelling in ankles  Loss of appetite  Skin Problems:  Rash  Itching  Skin blistering  Ulcers in mouth or other mucous membranes  Inflammation of brain (encephalitis):  Headache  Fever  Tiredness or weakness  Confusion  Memory problems  Sleepiness  Seeing or hearing things that are not there  Seizures  Stiff neck  Problems in other organ:  Changes in eye sight  Severe or persistent muscle or joint pains  Severe muscle weakness      SELF CARE ACTIVITIES WHILE ON CHEMOTHERAPY: Increase your fluid intake 48 hours prior to treatment and drink at least 2 quarts per day after treatment., No alcohol intake., No aspirin or other medications unless approved by your oncologist., Eat foods that are light and easy to digest., Eat foods at cold or room temperature., No fried, fatty, or spicy foods immediately before or after treatment., Have teeth cleaned professionally before starting treatment. Keep dentures and partial plates clean., Use soft toothbrush and do not use mouthwashes that contain alcohol. Biotene is a good mouthwash that is available at most pharmacies or may be ordered by calling [phone]., Use warm salt water gargles (1 teaspoon salt per 1 quart warm water) before and after meals and at bedtime. Or you may rinse with 2 tablespoons of  three -percent hydrogen peroxide mixed in eight ounces of water., Always use sunscreen with SPF (Sun Protection Factor) of 30 or higher., Use your nausea medication as directed to prevent nausea., Use your stool softener or laxative as directed to prevent constipation. and Use your anti-diarrheal medication as directed to stop diarrhea.  Please wash your hands for at  least 30 seconds using warm soapy water. Handwashing is the #1 way to prevent the spread of germs. Stay away from sick people or people who are getting over a cold. If you develop respiratory systems such as green/yellow mucus production or productive cough or persistent cough let us know and we will see if you need an antibiotic. It is a good idea to keep a pair of gloves on when going into grocery stores/[employer] to decrease your risk of coming into contact with germs on the carts, etc. Carry alcohol hand gel with you at all times and use it frequently if out in public. All foods need to be cooked thoroughly. No raw foods. No medium or undercooked meats, eggs. If your food is cooked medium well, it does not need to be hot pink or saturated with bloody liquid at all. Vegetables and fruits need to be washed/rinsed under the faucet with a dish detergent before being consumed. You can eat raw fruits and vegetables unless we tell you otherwise but it would be best if you cooked them or bought frozen. Do not eat off of salad bars or hot bars unless you really trust the cleanliness of the restaurant. If you need dental work, please let Dr. Whitney Muse know before you go for your appointment so that we can coordinate the best possible time for you in regards to your chemo regimen. You need to also let your dentist know that you are actively taking chemo. We may need to do labs prior to your dental appointment. We also want your bowels moving at least every other day. If this is not happening, we need to know so that we can get you on a bowel regimen to help you go.       MEDICATIONS: You have been given prescriptions for the following medications:  Zofran '8mg'$  tablet. Take 1 tablet every 8 hours as needed for nausea/vomiting. (#1 nausea med to take, this can constipate)  Compazine '10mg'$  tablet. Take 1 tablet every 6 hours as needed for nausea/vomiting. (#2 nausea med to take, this can make you  sleepy)    Over-the-Counter Meds:  Miralax 1 capful in 8 oz of fluid daily. May increase to two times a day if needed. This is a stool softener. If this doesn't work proceed you can add:  Senokot S  - start with 1 tablet two times a day and increase to 4 tablets two times a day if needed. (total of 8 tablets in a 24 hour period). This is a stimulant laxative.   Call us if this does not help your bowels move.     SYMPTOMS TO REPORT AS SOON AS POSSIBLE AFTER TREATMENT:  FEVER GREATER THAN 100.5 F  CHILLS WITH OR WITHOUT FEVER  NAUSEA AND VOMITING THAT IS NOT CONTROLLED WITH YOUR NAUSEA MEDICATION  UNUSUAL SHORTNESS OF BREATH  UNUSUAL BRUISING OR BLEEDING  TENDERNESS IN MOUTH AND THROAT WITH OR WITHOUT PRESENCE OF ULCERS  URINARY PROBLEMS  BOWEL PROBLEMS  UNUSUAL RASH    Wear comfortable clothing and clothing appropriate for easy access to any Portacath or PICC line. Let us know if there is anything  that we can do to make your therapy better!      I have been informed and understand all of the instructions given to me and have received a copy. I have been instructed to call the clinic [phone] or my family physician as soon as possible for continued medical care, if indicated. I do not have any more questions at this time but understand that I may call the [facility] or the Patient Navigator at [phone] during office hours should I have questions or need assistance in obtaining follow-up care.           Nivolumab injection What is this medicine? NIVOLUMAB (nye VOL ue mab) is used to treat certain types of melanoma and lung cancer. This medicine may be used for other purposes; ask your health care provider or pharmacist if you have questions. COMMON BRAND NAME(S): Opdivo What should I tell my health care provider before I take this medicine? They need to know if you have any of these conditions: -eye disease, vision problems -history of  pancreatitis -immune system problems -inflammatory bowel disease -kidney disease -liver disease -lung disease -lupus -myasthenia gravis -multiple sclerosis -organ transplant -stomach or intestine problems -thyroid disease -tingling of the fingers or toes, or other nerve disorder -an unusual or allergic reaction to nivolumab, other medicines, foods, dyes, or preservatives -pregnant or trying to get pregnant -breast-feeding How should I use this medicine? This medicine is for infusion into a vein. It is given by a health care professional in a hospital or clinic setting. A special MedGuide will be given to you before each treatment. Be sure to read this information carefully each time. Talk to your pediatrician regarding the use of this medicine in children. Special care may be needed. Overdosage: If you think you've taken too much of this medicine contact a poison control center or emergency room at once. Overdosage: If you think you have taken too much of this medicine contact a poison control center or emergency room at once. NOTE: This medicine is only for you. Do not share this medicine with others. What if I miss a dose? It is important not to miss your dose. Call your doctor or health care professional if you are unable to keep an appointment. What may interact with this medicine? Interactions have not been studied. This list may not describe all possible interactions. Give your health care provider a list of all the medicines, herbs, non-prescription drugs, or dietary supplements you use. Also tell them if you smoke, drink alcohol, or use illegal drugs. Some items may interact with your medicine. What should I watch for while using this medicine? Tell your doctor or healthcare professional if your symptoms do not start to get better or if they get worse. Your condition will be monitored carefully while you are receiving this medicine. You may need blood work done while you are  taking this medicine. What side effects may I notice from receiving this medicine? Side effects that you should report to your doctor or health care professional as soon as possible: -allergic reactions like skin rash, itching or hives, swelling of the face, lips, or tongue -black, tarry stools -bloody or watery diarrhea -changes in vision -chills -cough -depressed mood -eye pain -feeling anxious -fever -general ill feeling or flu-like symptoms -hair loss -loss of appetite -low blood counts - this medicine may decrease the number of white blood cells, red blood cells and platelets. You may be at increased risk for infections and  bleeding -pain, tingling, numbness in the hands or feet -redness, blistering, peeling or loosening of the skin, including inside the mouth -red pinpoint spots on skin -signs of decreased platelets or bleeding - bruising, pinpoint red spots on the skin, black, tarry stools, blood in the urine -signs of decreased red blood cells - unusually weak or tired, feeling faint or lightheaded, falls -signs of infection - fever or chills, cough, sore throat, pain or trouble passing urine -signs and symptoms of a dangerous change in heartbeat or heart rhythm like chest pain; dizziness; fast or irregular heartbeat; palpitations; feeling faint or lightheaded, falls; breathing problems -signs and symptoms of high blood sugar such as dizziness; dry mouth; dry skin; fruity breath; nausea; stomach pain; increased hunger or thirst; increased urination -signs and symptoms of kidney injury like trouble passing urine or change in the amount of urine -signs and symptoms of liver injury like dark yellow or brown urine; general ill feeling or flu-like symptoms; light-colored stools; loss of appetite; nausea; right upper belly pain; unusually weak or tired; yellowing of the eyes or skin -signs and symptoms of increased potassium like muscle weakness; chest pain; or fast, irregular  heartbeat -signs and symptoms of low potassium like muscle cramps or muscle pain; chest pain; dizziness; feeling faint or lightheaded, falls; palpitations; breathing problems; or fast, irregular heartbeat -swelling of the ankles, feet, hands -weight gainSide effects that usually do not require medical attention (report to your doctor or health care professional if they continue or are bothersome): -constipation -general ill feeling or flu-like symptoms -hair loss -loss of appetite -nausea, vomiting This list may not describe all possible side effects. Call your doctor for medical advice about side effects. You may report side effects to FDA at 1-800-FDA-1088. Where should I keep my medicine? This drug is given in a hospital or clinic and will not be stored at home. NOTE: This sheet is a summary. It may not cover all possible information. If you have questions about this medicine, talk to your doctor, pharmacist, or health care provider.  2015, Elsevier/Gold Standard. (2013-05-20 13:18:19) Prednisone tablets What is this medicine? PREDNISONE (PRED ni sone) is a corticosteroid. It is commonly used to treat inflammation of the skin, joints, lungs, and other organs. Common conditions treated include asthma, allergies, and arthritis. It is also used for other conditions, such as blood disorders and diseases of the adrenal glands. This medicine may be used for other purposes; ask your health care provider or pharmacist if you have questions. COMMON BRAND NAME(S): Deltasone, Predone, Sterapred, Sterapred DS What should I tell my health care provider before I take this medicine? They need to know if you have any of these conditions: -Cushing's syndrome -diabetes -glaucoma -heart disease -high blood pressure -infection (especially a virus infection such as chickenpox, cold sores, or herpes) -kidney disease -liver disease -mental illness -myasthenia gravis -osteoporosis -seizures -stomach or  intestine problems -thyroid disease -an unusual or allergic reaction to lactose, prednisone, other medicines, foods, dyes, or preservatives -pregnant or trying to get pregnant -breast-feeding How should I use this medicine? Take this medicine by mouth with a glass of water. Follow the directions on the prescription label. Take this medicine with food. If you are taking this medicine once a day, take it in the morning. Do not take more medicine than you are told to take. Do not suddenly stop taking your medicine because you may develop a severe reaction. Your doctor will tell you how much medicine to take. If your doctor  wants you to stop the medicine, the dose may be slowly lowered over time to avoid any side effects. Talk to your pediatrician regarding the use of this medicine in children. Special care may be needed. Overdosage: If you think you have taken too much of this medicine contact a poison control center or emergency room at once. NOTE: This medicine is only for you. Do not share this medicine with others. What if I miss a dose? If you miss a dose, take it as soon as you can. If it is almost time for your next dose, talk to your doctor or health care professional. You may need to miss a dose or take an extra dose. Do not take double or extra doses without advice. What may interact with this medicine? Do not take this medicine with any of the following medications: -metyrapone -mifepristone This medicine may also interact with the following medications: -aminoglutethimide -amphotericin B -aspirin and aspirin-like medicines -barbiturates -certain medicines for diabetes, like glipizide or glyburide -cholestyramine -cholinesterase inhibitors -cyclosporine -digoxin -diuretics -ephedrine -male hormones, like estrogens and birth control pills -isoniazid -ketoconazole -NSAIDS, medicines for pain and inflammation, like ibuprofen or  naproxen -phenytoin -rifampin -toxoids -vaccines -warfarin This list may not describe all possible interactions. Give your health care provider a list of all the medicines, herbs, non-prescription drugs, or dietary supplements you use. Also tell them if you smoke, drink alcohol, or use illegal drugs. Some items may interact with your medicine. What should I watch for while using this medicine? Visit your doctor or health care professional for regular checks on your progress. If you are taking this medicine over a prolonged period, carry an identification card with your name and address, the type and dose of your medicine, and your doctor's name and address. This medicine may increase your risk of getting an infection. Tell your doctor or health care professional if you are around anyone with measles or chickenpox, or if you develop sores or blisters that do not heal properly. If you are going to have surgery, tell your doctor or health care professional that you have taken this medicine within the last twelve months. Ask your doctor or health care professional about your diet. You may need to lower the amount of salt you eat. This medicine may affect blood sugar levels. If you have diabetes, check with your doctor or health care professional before you change your diet or the dose of your diabetic medicine. What side effects may I notice from receiving this medicine? Side effects that you should report to your doctor or health care professional as soon as possible: -allergic reactions like skin rash, itching or hives, swelling of the face, lips, or tongue -changes in emotions or moods -changes in vision -depressed mood -eye pain -fever or chills, cough, sore throat, pain or difficulty passing urine -increased thirst -swelling of ankles, feet Side effects that usually do not require medical attention (report to your doctor or health care professional if they continue or are  bothersome): -confusion, excitement, restlessness -headache -nausea, vomiting -skin problems, acne, thin and shiny skin -trouble sleeping -weight gain This list may not describe all possible side effects. Call your doctor for medical advice about side effects. You may report side effects to FDA at 1-800-FDA-1088. Where should I keep my medicine? Keep out of the reach of children. Store at room temperature between 15 and 30 degrees C (59 and 86 degrees F). Protect from light. Keep container tightly closed. Throw away any unused medicine after  the expiration date. NOTE: This sheet is a summary. It may not cover all possible information. If you have questions about this medicine, talk to your doctor, pharmacist, or health care provider.  2015, Elsevier/Gold Standard. (2010-10-14 10:57:14)

## 2014-12-04 ENCOUNTER — Encounter (HOSPITAL_COMMUNITY): Payer: Medicare Other

## 2014-12-04 DIAGNOSIS — C3411 Malignant neoplasm of upper lobe, right bronchus or lung: Secondary | ICD-10-CM

## 2014-12-04 NOTE — Progress Notes (Signed)
Immunotherapy teaching done for Bradley Molina. Consent to be signed for Opdivo on 9/29 @ appt. Immunotherapy card given in red folder. Prednisone prescription has already been picked up by patient's wife. Wife educated as to when to start the Prednisone. Opdivo instructions to also be mailed out to patient today.

## 2014-12-11 ENCOUNTER — Encounter (HOSPITAL_BASED_OUTPATIENT_CLINIC_OR_DEPARTMENT_OTHER): Payer: Medicare Other

## 2014-12-11 VITALS — BP 113/76 | HR 63 | Temp 98.2°F | Resp 18

## 2014-12-11 DIAGNOSIS — Z5112 Encounter for antineoplastic immunotherapy: Secondary | ICD-10-CM

## 2014-12-11 DIAGNOSIS — C3411 Malignant neoplasm of upper lobe, right bronchus or lung: Secondary | ICD-10-CM | POA: Diagnosis not present

## 2014-12-11 DIAGNOSIS — R918 Other nonspecific abnormal finding of lung field: Secondary | ICD-10-CM | POA: Diagnosis not present

## 2014-12-11 LAB — COMPREHENSIVE METABOLIC PANEL
ALBUMIN: 1.7 g/dL — AB (ref 3.5–5.0)
ALT: 19 U/L (ref 17–63)
AST: 24 U/L (ref 15–41)
Alkaline Phosphatase: 124 U/L (ref 38–126)
Anion gap: 5 (ref 5–15)
BILIRUBIN TOTAL: 0.5 mg/dL (ref 0.3–1.2)
BUN: 12 mg/dL (ref 6–20)
CALCIUM: 7.8 mg/dL — AB (ref 8.9–10.3)
CO2: 26 mmol/L (ref 22–32)
Chloride: 106 mmol/L (ref 101–111)
Creatinine, Ser: 0.66 mg/dL (ref 0.61–1.24)
GFR calc Af Amer: >60 mL/min (ref >60)
GFR calc non Af Amer: >60 mL/min (ref >60)
GLUCOSE: 100 mg/dL — AB (ref 65–99)
POTASSIUM: 4 mmol/L (ref 3.5–5.1)
Sodium: 137 mmol/L (ref 135–145)
Total Protein: 7.5 g/dL (ref 6.5–8.1)

## 2014-12-11 LAB — CBC WITH DIFFERENTIAL/PLATELET
BASOS ABS: 0 10*3/uL (ref 0.0–0.1)
BASOS PCT: 0 %
Eosinophils Absolute: 0.1 10*3/uL (ref 0.0–0.7)
Eosinophils Relative: 3 %
HEMATOCRIT: 28.7 % — AB (ref 39.0–52.0)
HEMOGLOBIN: 8.6 g/dL — AB (ref 13.0–17.0)
LYMPHS PCT: 10 %
Lymphs Abs: 0.6 10*3/uL — ABNORMAL LOW (ref 0.7–4.0)
MCH: 23.8 pg — ABNORMAL LOW (ref 26.0–34.0)
MCHC: 30 g/dL (ref 30.0–36.0)
MCV: 79.5 fL (ref 78.0–100.0)
Monocytes Absolute: 1.1 10*3/uL — ABNORMAL HIGH (ref 0.1–1.0)
Monocytes Relative: 21 %
NEUTROS ABS: 3.5 10*3/uL (ref 1.7–7.7)
NEUTROS PCT: 66 %
Platelets: 543 10*3/uL — ABNORMAL HIGH (ref 150–400)
RBC: 3.61 MIL/uL — ABNORMAL LOW (ref 4.22–5.81)
RDW: 15.6 % — ABNORMAL HIGH (ref 11.5–15.5)
WBC: 5.4 10*3/uL (ref 4.0–10.5)

## 2014-12-11 LAB — TSH: TSH: 0.563 u[IU]/mL (ref 0.350–4.500)

## 2014-12-11 MED ORDER — SODIUM CHLORIDE 0.9 % IV SOLN
Freq: Once | INTRAVENOUS | Status: AC
  Administered 2014-12-11: 11:00:00 via INTRAVENOUS

## 2014-12-11 MED ORDER — SODIUM CHLORIDE 0.9 % IV SOLN
240.0000 mg | Freq: Once | INTRAVENOUS | Status: AC
  Administered 2014-12-11: 240 mg via INTRAVENOUS
  Filled 2014-12-11: qty 20

## 2014-12-11 MED ORDER — SODIUM CHLORIDE 0.9 % IJ SOLN: 10.0000 mL | INTRAMUSCULAR | Status: DC | PRN

## 2014-12-11 NOTE — Patient Instructions (Signed)
[facility] Discharge Instructions for Patients Receiving Chemotherapy  Today you received the following chemotherapy agent: Opdivo  To help prevent nausea and vomiting after your treatment, we encourage you to take your nausea medication Zofran '8mg'$  every 8 hours as needed for nausea or vomiting. Please refer to your folder for additional information on your chemotherapy and some of the side effects you may experience.  Please call the clinic if you have ANY questions or concerns.     If you develop nausea and vomiting, or diarrhea that is not controlled by your medication, call the clinic.  The clinic phone number is [phone]. Office hours are Monday-Friday 8:30am-5:00pm.  BELOW ARE SYMPTOMS THAT SHOULD BE REPORTED IMMEDIATELY:  *FEVER GREATER THAN 101.0 F  *CHILLS WITH OR WITHOUT FEVER  NAUSEA AND VOMITING THAT IS NOT CONTROLLED WITH YOUR NAUSEA MEDICATION  *UNUSUAL SHORTNESS OF BREATH  *UNUSUAL BRUISING OR BLEEDING  TENDERNESS IN MOUTH AND THROAT WITH OR WITHOUT PRESENCE OF ULCERS  *URINARY PROBLEMS  *BOWEL PROBLEMS  UNUSUAL RASH Items with * indicate a potential emergency and should be followed up as soon as possible. If you have an emergency after office hours please contact your primary care physician or go to the nearest emergency department.  Please call the clinic during office hours if you have any questions or concerns.   You may also contact the Patient Navigator at [phone] should you have any questions or need assistance in obtaining follow up care. _____________________________________________________________________ Have you asked about our STAR program?    STAR stands for Survivorship Training and Rehabilitation, and this is a nationally recognized cancer care program that focuses on survivorship and rehabilitation.  Cancer and cancer treatments may cause problems, such as, pain, making you feel tired and keeping you from doing the  things that you need or want to do. Cancer rehabilitation can help. Our goal is to reduce these troubling effects and help you have the best quality of life possible.  You may receive a survey from a nurse that asks questions about your current state of health.  Based on the survey results, all eligible patients will be referred to the [facility] program for an evaluation so we can better serve you! A frequently asked questions sheet is available upon request.

## 2014-12-11 NOTE — Progress Notes (Signed)
Patient tolerated infusion well.  Side effects and things to monitor for discussed with the patient and wife during chemo appointment.  Patient signed chemotherapy consent for Gemzar prior to treatment and red folder with chemotherapy information given to the patient's wife per patient request.  VSS post infusion and the patient was able to ambulate out of the treatment area.

## 2014-12-15 ENCOUNTER — Encounter (HOSPITAL_COMMUNITY): Payer: Self-pay | Admitting: *Deleted

## 2014-12-15 ENCOUNTER — Other Ambulatory Visit (HOSPITAL_COMMUNITY): Payer: Self-pay | Admitting: *Deleted

## 2014-12-15 ENCOUNTER — Telehealth (HOSPITAL_COMMUNITY): Payer: Self-pay | Admitting: Oncology

## 2014-12-15 DIAGNOSIS — C3411 Malignant neoplasm of upper lobe, right bronchus or lung: Secondary | ICD-10-CM

## 2014-12-15 MED ORDER — MORPHINE SULFATE ER 60 MG PO TBCR: 60.0000 mg | EXTENDED_RELEASE_TABLET | Freq: Two times a day (BID) | ORAL | Status: DC

## 2014-12-15 MED ORDER — HYDROCODONE-ACETAMINOPHEN 10-325 MG PO TABS: 1.0000 | ORAL_TABLET | Freq: Four times a day (QID) | ORAL | Status: DC | PRN

## 2014-12-15 NOTE — Progress Notes (Signed)
Patient doing fine post Opdivo. The previous note written by Josephina Shih states that patient signed consent for Gemzar but patient actually signed consent for Opdivo.

## 2014-12-18 ENCOUNTER — Encounter (HOSPITAL_COMMUNITY): Payer: Self-pay | Admitting: Hematology & Oncology

## 2014-12-18 ENCOUNTER — Ambulatory Visit (HOSPITAL_COMMUNITY)
Admission: RE | Admit: 2014-12-18 | Discharge: 2014-12-18 | Disposition: A | Discharge status: Home / Self Care | Payer: Medicare Other | Source: Ambulatory Visit | Attending: Hematology & Oncology | Admitting: Hematology & Oncology

## 2014-12-18 ENCOUNTER — Encounter (HOSPITAL_COMMUNITY): Payer: Medicare Other | Attending: Oncology | Admitting: Hematology & Oncology

## 2014-12-18 VITALS — BP 124/79 | HR 75 | Temp 98.2°F | Resp 22 | Wt 164.8 lb

## 2014-12-18 DIAGNOSIS — Z0000 Encounter for general adult medical examination without abnormal findings: Secondary | ICD-10-CM

## 2014-12-18 DIAGNOSIS — I639 Cerebral infarction, unspecified: Secondary | ICD-10-CM | POA: Insufficient documentation

## 2014-12-18 DIAGNOSIS — Z8673 Personal history of transient ischemic attack (TIA), and cerebral infarction without residual deficits: Secondary | ICD-10-CM

## 2014-12-18 DIAGNOSIS — J910 Malignant pleural effusion: Secondary | ICD-10-CM | POA: Diagnosis not present

## 2014-12-18 DIAGNOSIS — R1310 Dysphagia, unspecified: Secondary | ICD-10-CM | POA: Diagnosis not present

## 2014-12-18 DIAGNOSIS — R05 Cough: Secondary | ICD-10-CM | POA: Diagnosis not present

## 2014-12-18 DIAGNOSIS — D649 Anemia, unspecified: Secondary | ICD-10-CM | POA: Diagnosis not present

## 2014-12-18 DIAGNOSIS — C3490 Malignant neoplasm of unspecified part of unspecified bronchus or lung: Secondary | ICD-10-CM

## 2014-12-18 DIAGNOSIS — C3411 Malignant neoplasm of upper lobe, right bronchus or lung: Secondary | ICD-10-CM | POA: Diagnosis not present

## 2014-12-18 DIAGNOSIS — Z23 Encounter for immunization: Secondary | ICD-10-CM

## 2014-12-18 DIAGNOSIS — R918 Other nonspecific abnormal finding of lung field: Secondary | ICD-10-CM | POA: Insufficient documentation

## 2014-12-18 DIAGNOSIS — R0602 Shortness of breath: Secondary | ICD-10-CM | POA: Insufficient documentation

## 2014-12-18 MED ORDER — INFLUENZA VAC SPLIT QUAD 0.5 ML IM SUSY
0.5000 mL | PREFILLED_SYRINGE | Freq: Once | INTRAMUSCULAR | Status: AC
  Administered 2014-12-18: 0.5 mL via INTRAMUSCULAR
  Filled 2014-12-18: qty 0.5

## 2014-12-18 NOTE — Progress Notes (Signed)
Bradley Molina presents today for injection per MD orders. Flu vaccine administered IM in left deltoid. Administration without incident. Patient tolerated well.

## 2014-12-18 NOTE — Patient Instructions (Signed)
[place] at [facility] Discharge Instructions  RECOMMENDATIONS MADE BY THE CONSULTANT AND ANY TEST RESULTS WILL BE SENT TO YOUR REFERRING PHYSICIAN.  Exam and discussion by Dr. Whitney Muse Flu shot today Chest xray today Referral to Speech Therapy for swallowing evaluation Let us know next week about Physical therapy. Treatment next week Follow-up next week   Thank you for choosing [place] at [facility] to provide your oncology and hematology care.  To afford each patient quality time with our provider, please arrive at least 15 minutes before your scheduled appointment time.    You need to re-schedule your appointment should you arrive 10 or more minutes late.  We strive to give you quality time with our providers, and arriving late affects you and other patients whose appointments are after yours.  Also, if you no show three or more times for appointments you may be dismissed from the clinic at the providers discretion.     Again, thank you for choosing [facility].  Our hope is that these requests will decrease the amount of time that you wait before being seen by our physicians.       _____________________________________________________________  Should you have questions after your visit to [facility], please contact our office at [phone] between the hours of 8:30 a.m. and 4:30 p.m.  Voicemails left after 4:30 p.m. will not be returned until the following business day.  For prescription refill requests, have your pharmacy contact our office.

## 2014-12-18 NOTE — Progress Notes (Signed)
[place] [facility] [place] Note   CHIEF COMPLAINTS/PURPOSE OF CONSULTATION:  Second opinion on: Lung cancer, no biopsy Recent CVA 06/30/14 Admitted at [facility]. He presented with left sided weakness and slurred speech Anemia Pleurx catheter placement in right lung, malignant plural effusion MET amplification XALKORI Progression, on Nivolumab 12/11/2014  HISTORY OF PRESENTING ILLNESS:  Bradley Molina 59 y.o. male is here for follow up . 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 He was on XALKORI, unfortunately progressed and has now started Nivolumab.  The patient has complaints of trouble breathing.  He notes that this is not worse than before.  He does not think that his immunotherapy treatment presented any new symptoms.  He feels as if his pain is controlled.  His wife states the patient has been eating better.  She believes that he is doing well.  The patient admits that he is struggling with complete recovery from his stroke.  His left leg has weakened more.  He has recently fell on it while in the driveway.  He would not like more therapy right now.  He reports of a cough that developed since his last visit.  He notes that he has trouble swallowing at times.    He is still taking his aspirin daily.  He will have his flu shot today.    MEDICAL HISTORY:  Past Medical History  Diagnosis Date  . Lung cancer ([place])   . Arthritis   . Hypertension   . Allergy   . COPD (chronic obstructive pulmonary disease) ([place])   . Diabetes mellitus without complication ([place])   . Stroke ([place])   . Hyperlipidemia     SURGICAL HISTORY: Past Surgical History  Procedure Laterality  Date  . Pleurx catheter      right side  . Peripherally inserted central catheter insertion Right 08/2014    SOCIAL HISTORY: Social History   Social History  . Marital Status: Married    Spouse Name: N/A  . Number of Children: N/A  . Years of Education: N/A   Occupational History  . Not on file.   Social History Main Topics  . Smoking status: Former Smoker -- 1.00 packs/day for 40 years    Types: Cigarettes    Quit date: 06/17/2014  . Smokeless tobacco: Not on file  . Alcohol Use: No  . Drug Use: No  . Sexual Activity: Not on file   Other Topics Concern  . Not on file   Social History Narrative  Married for 25 years with 3 children and 7 grandchildren He worked with furniture Former smoker; quit last month. Prior 40 to 50 pack year history Former drinker  FAMILY HISTORY: Family History  Problem Relation Age of Onset  . Cancer Mother   . COPD Mother   . Emphysema Father    indicated that his mother is deceased. He indicated that his father is deceased.  Mother was in early 23s when she died of lung cancer. Father died of emphysema. His brother died.  3 living sisters, 2 other sisters died.  ALLERGIES:  is allergic to penicillins.  MEDICATIONS:  Current Outpatient Prescriptions  Medication Sig Dispense Refill  . Alum & Mag Hydroxide-Simeth (MAGIC MOUTHWASH W/LIDOCAINE) SOLN Swish and swallow 28m (1 teaspoonful) every  4 hours. (Patient taking differently: Take 5 mLs by mouth 4 (four) times daily as needed for mouth pain. ) 360 mL 1  . ammonium lactate (AMLACTIN) 12 % cream Apply topically as needed for dry skin.    Marland Kitchen aspirin 81 MG tablet Take 162 mg by mouth 2 (two) times daily.     . crizotinib (XALKORI) 250 MG capsule Take 1 capsule (250 mg total) by mouth 2 (two) times daily. 60 capsule 3  . docusate sodium (EQUATE STOOL SOFTENER) 100 MG capsule Take 100 mg by mouth 2 (two) times daily as needed for mild constipation.     Marland Kitchen HYDROcodone-acetaminophen (NORCO)  10-325 MG tablet Take 1 tablet by mouth every 6 (six) hours as needed for moderate pain. 120 tablet 0  . magnesium hydroxide (MILK OF MAGNESIA) 800 MG/5ML suspension Take 15 mLs by mouth 2 (two) times daily as needed for constipation.    . metFORMIN (GLUCOPHAGE) 500 MG tablet Take 500 mg by mouth 2 (two) times daily with a meal.    . morphine (MS CONTIN) 60 MG 12 hr tablet Take 1 tablet (60 mg total) by mouth every 12 (twelve) hours. 60 tablet 0  . Nivolumab (OPDIVO IV) Inject into the vein every 14 (fourteen) days. To start 12/11/14    . ondansetron (ZOFRAN) 8 MG tablet Take 8 mg by mouth every 8 (eight) hours as needed for nausea or vomiting.     . polyethylene glycol (MIRALAX / GLYCOLAX) packet Take 17 g by mouth daily as needed for mild constipation.     . potassium chloride SA (K-DUR,KLOR-CON) 20 MEQ tablet Take 2 tablets (40 mEq total) by mouth 2 (two) times daily. 60 tablet 0  . predniSONE (DELTASONE) 10 MG tablet Take 50 mg PO daily for side effects of Opdivo as reviewed in chemo teaching. 120 tablet 0  . prochlorperazine (COMPAZINE) 10 MG tablet Take 10 mg by mouth every 6 (six) hours as needed for nausea or vomiting.      No current facility-administered medications for this visit.    Review of Systems  Constitutional: Positive for malaise/fatigue. Negative for fever, chills and weight loss.   HENT: Negative for congestion, ear discharge, ear pain, hearing loss, nosebleeds, sore throat and tinnitus.  Difficulty swallowing Eyes: Negative for blurred vision, double vision, photophobia, pain, discharge and redness.  Respiratory: Negative for cough and stridor.   Cardiovascular: Negative for chest pain, palpitations, orthopnea, claudication, leg swelling and PND.  Gastrointestinal: Negative for nausea, vomiting and abdominal pain.  Genitourinary: Negative for dysuria and hematuria.  Musculoskeletal: Positive for joint pain and neck pain. Negative for myalgias. Weakened left leg Skin:  Negative for rash.  Neurological: Positive for focal weakness and weakness. Negative for dizziness, tingling, tremors, sensory change, speech change, seizures, loss of consciousness and headaches.       Uses walker  Endo/Heme/Allergies: Negative.   Psychiatric/Behavioral: Negative for depression, suicidal ideas, hallucinations, memory loss and substance abuse. The patient has insomnia.    14 point ROS was done and is otherwise as detailed above or in HPI   PHYSICAL EXAMINATION: ECOG PERFORMANCE STATUS: 2 - Symptomatic, <50% confined to bed  Filed Vitals:   12/18/14 0910  BP: 124/79  Pulse: 75  Temp: 98.2 F (36.8 C)  Resp: 22   Filed Weights   12/18/14 0910  Weight: 164 lb 12.8 oz (74.753 kg)   Physical Exam  Constitutional: He is oriented to person, place, and time. No distress.  thin  HENT:  Head: Normocephalic and atraumatic.  Mouth/Throat: Oropharynx is clear and moist. No oropharyngeal exudate.  Eyes: Conjunctivae are normal. Pupils are equal, round, and reactive to light. Right eye exhibits no discharge. Left eye exhibits no discharge. No scleral icterus.  Neck: Normal range of motion. Neck supple.  Cardiovascular: Normal rate and regular rhythm.   Pulmonary/Chest: occasional coarse rhonchi that clear with a cough Abdominal: Soft. Bowel sounds are normal. He exhibits no distension and no mass. There is no tenderness. There is no rebound and no guarding.  Musculoskeletal: Normal range of motion.  Lymphadenopathy:    He has no cervical adenopathy.  Neurological: He is alert and oriented to person, place, and time.  Uses walker, decreased strength LLE. Decreased hip flexion strength and quad strength Skin: Skin is warm and dry. He is not diaphoretic. No erythema.  Psychiatric: Mood, memory, affect and judgment normal.    LABORATORY DATA:  I have reviewed the data as listed Lab Results  Component Value Date   WBC 5.4 12/11/2014   HGB 8.6* 12/11/2014   HCT 28.7*  12/11/2014   MCV 79.5 12/11/2014   PLT 543* 12/11/2014   CMP     Component Value Date/Time   NA 137 12/11/2014 1040   K 4.0 12/11/2014 1040   CL 106 12/11/2014 1040   CO2 26 12/11/2014 1040   GLUCOSE 100* 12/11/2014 1040   BUN 12 12/11/2014 1040   CREATININE 0.66 12/11/2014 1040   CALCIUM 7.8* 12/11/2014 1040   PROT 7.5 12/11/2014 1040   ALBUMIN 1.7* 12/11/2014 1040   AST 24 12/11/2014 1040   ALT 19 12/11/2014 1040   ALKPHOS 124 12/11/2014 1040   BILITOT 0.5 12/11/2014 1040   GFRNONAA >60 12/11/2014 1040   GFRAA >60 12/11/2014 1040   RADIOLOGY: CLINICAL DATA: Follow up right-sided lung cancer, restaging. Completed chemotherapy and radiation. Quit smoking April 2016.  EXAM: CT CHEST WITH CONTRAST  TECHNIQUE: Multidetector CT imaging of the chest was performed during intravenous contrast administration.  CONTRAST: 56m OMNIPAQUE IOHEXOL 300 MG/ML SOLN  COMPARISON: 10/16/2014 CT, chest radiograph 10/20/2014  FINDINGS: Mediastinum/Nodes: Anterior mediastinal mass measures 3.7 x 2.3 cm image 22, decreased from previously.  Heart size is normal. Small pericardial effusion reidentified.  Sub carinal centrally necrotic lymphadenopathy measuring 1.3 cm is smaller, image 33.  Great vessels are normal in caliber. Mild atheromatous aortic and coronary arterial calcification.  Lungs/Pleura: Right-sided chest tube has been removed. Right upper lobe mass measuring 7.5 x 5.8 cm image 29 is increased from previously, with increased right hilar and right pulmonary vein erosion/involvement.  Diffuse emphysematous changes are noted. New multilobar irregular patchy areas of airspace opacity with central bronchogram formation are new and increased since previously in the right upper lobe, right middle lobe, and superior segment right lower lobe. Subpleural nodularity is reidentified, dominant 7 mm left lower lobe pulmonary nodule identified image 53, increased  from 5 mm previously.  No pneumothorax.  Stable degree of right greater than left pleural fluid and/or thickening.  Upper abdomen: 2 mm too small to characterize hypodense lesion at the dome of the right hepatic lobe image 53 is stable.  Musculoskeletal: Leftward curvature of the thoracolumbar spine partly visualized. No lytic or sclerotic osseous lesion. Multilevel disc degenerative change noted in the spine.  IMPRESSION: Increase in size of dominant right upper lobe paracentral mass compatible with progression of non-small-cell lung cancer, although there is a mixed response with decrease in size of anterior mediastinal mass and subcarinal lymphadenopathy.  Pulmonary vein  and hilar invasion reidentified by tumor.  New multi focal right hemithoracic areas of irregular pulmonary parenchymal opacity, for which differential considerations include metastatic involvement, infection/inflammation, or less likely radiation fibrosis given lack of defined linear contour.   Electronically Signed  By: Conchita Paris M.D.  On: 12/01/2014 14:31   ASSESSMENT & PLAN:  CUP, most consistent with LUNG primary Biopsy on 08/04/2014 c/w poorly differentiated carcinoma Recent CVA 06/30/14 Admitted at [facility]. He presented with left sided weakness and slurred speech Anemia Pleurx catheter placement in right lung, malignant plural effusion MET amplification XALKORI Palliative XRT with good pain control DNR status  His anemia has been an ongoing issue and we may need to consider the addition of Aranesp 500 g every 3 weeks. I will address this next week when he returns for therapy and based upon his lab studies.  It is hard to tell what part of his disability stems from his malignancy versus the stroke that he had around the time of diagnosis. I have encouraged him to consider physical therapy as I feel that his lower extremity weakness is progressive, physical  therapy could certainly help him. Given his swallowing complaints have recommended a speech evaluation and he is willing to proceed with that.  I will get a chest x-ray today. He had his Pleurx catheter removed. Although he is short of breath he feels it is not worse than prior baseline.  We addressed end-of-life issues today. The patient and his wife both stated he does not like to discuss it. He is a DO NOT RESUSCITATE. His wife states they have the paperwork for this at home on the refrigerator.  All questions were answered. The patient knows to call the clinic with any problems, questions or concerns.   This note was electronically signed.   This document serves as a record of services personally performed by Ancil Linsey, MD. It was created on her behalf by Janace Hoard, a trained medical scribe. The creation of this record is based on the scribe's personal observations and the provider's statements to them. This document has been checked and approved by the attending provider.   I have reviewed the above documentation for accuracy and completeness, and I agree with the above.  Kelby Fam. Erandi Lemma MD

## 2014-12-25 ENCOUNTER — Encounter (HOSPITAL_BASED_OUTPATIENT_CLINIC_OR_DEPARTMENT_OTHER): Payer: Medicare Other

## 2014-12-25 ENCOUNTER — Encounter (HOSPITAL_COMMUNITY): Payer: Medicare Other | Attending: Oncology | Admitting: Oncology

## 2014-12-25 VITALS — BP 107/71 | HR 62 | Temp 98.1°F | Resp 18

## 2014-12-25 VITALS — BP 108/72 | HR 96 | Temp 98.6°F | Resp 18 | Wt 161.6 lb

## 2014-12-25 DIAGNOSIS — R918 Other nonspecific abnormal finding of lung field: Secondary | ICD-10-CM | POA: Diagnosis present

## 2014-12-25 DIAGNOSIS — Z5112 Encounter for antineoplastic immunotherapy: Secondary | ICD-10-CM

## 2014-12-25 DIAGNOSIS — C3411 Malignant neoplasm of upper lobe, right bronchus or lung: Secondary | ICD-10-CM

## 2014-12-25 LAB — CBC WITH DIFFERENTIAL/PLATELET
BASOS ABS: 0 10*3/uL (ref 0.0–0.1)
Basophils Relative: 0 %
Eosinophils Absolute: 0.1 10*3/uL (ref 0.0–0.7)
Eosinophils Relative: 2 %
HEMATOCRIT: 31.5 % — AB (ref 39.0–52.0)
HEMOGLOBIN: 9.5 g/dL — AB (ref 13.0–17.0)
LYMPHS PCT: 8 %
Lymphs Abs: 0.6 10*3/uL — ABNORMAL LOW (ref 0.7–4.0)
MCH: 23.8 pg — ABNORMAL LOW (ref 26.0–34.0)
MCHC: 30.2 g/dL (ref 30.0–36.0)
MCV: 78.9 fL (ref 78.0–100.0)
MONO ABS: 1 10*3/uL (ref 0.1–1.0)
MONOS PCT: 14 %
NEUTROS ABS: 5.7 10*3/uL (ref 1.7–7.7)
NEUTROS PCT: 76 %
Platelets: 562 10*3/uL — ABNORMAL HIGH (ref 150–400)
RBC: 3.99 MIL/uL — ABNORMAL LOW (ref 4.22–5.81)
RDW: 15.2 % (ref 11.5–15.5)
WBC: 7.4 10*3/uL (ref 4.0–10.5)

## 2014-12-25 LAB — COMPREHENSIVE METABOLIC PANEL
ALK PHOS: 130 U/L — AB (ref 38–126)
ALT: 25 U/L (ref 17–63)
AST: 36 U/L (ref 15–41)
Albumin: 1.7 g/dL — ABNORMAL LOW (ref 3.5–5.0)
Anion gap: 7 (ref 5–15)
BILIRUBIN TOTAL: 0.5 mg/dL (ref 0.3–1.2)
BUN: 11 mg/dL (ref 6–20)
CALCIUM: 8.2 mg/dL — AB (ref 8.9–10.3)
CO2: 25 mmol/L (ref 22–32)
CREATININE: 0.9 mg/dL (ref 0.61–1.24)
Chloride: 106 mmol/L (ref 101–111)
GFR calc Af Amer: >60 mL/min (ref >60)
GLUCOSE: 126 mg/dL — AB (ref 65–99)
POTASSIUM: 4.1 mmol/L (ref 3.5–5.1)
Sodium: 138 mmol/L (ref 135–145)
TOTAL PROTEIN: 7.9 g/dL (ref 6.5–8.1)

## 2014-12-25 MED ORDER — PREDNISONE 10 MG PO TABS: ORAL_TABLET | ORAL | Status: DC

## 2014-12-25 MED ORDER — SODIUM CHLORIDE 0.9 % IV SOLN
Freq: Once | INTRAVENOUS | Status: AC
  Administered 2014-12-25: 12:00:00 via INTRAVENOUS

## 2014-12-25 MED ORDER — SODIUM CHLORIDE 0.9 % IJ SOLN: 10.0000 mL | INTRAMUSCULAR | Status: DC | PRN

## 2014-12-25 MED ORDER — SODIUM CHLORIDE 0.9 % IV SOLN
240.0000 mg | Freq: Once | INTRAVENOUS | Status: AC
  Administered 2014-12-25: 240 mg via INTRAVENOUS
  Filled 2014-12-25: qty 20

## 2014-12-25 NOTE — Progress Notes (Signed)
[name], MD [facility] [address] [place] [address]  Malignant neoplasm of upper lobe of right lung ([place]) - Plan: predniSONE (DELTASONE) 10 MG tablet  CURRENT THERAPY: Nivolumab 240 mg IV every 2 weeks beginning on 12/11/2014.  INTERVAL HISTORY: Bradley Molina 59 y.o. male returns for followup of right upper lobe mass with right pleural effusion, initially referred to Hospice, but reporting to [facility] for second opinion regarding possible treatment options. He is S/P palliative radiation from 6/2- 08/27/2014. 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  However, in Sept 2016, he was noted to have progressive disease on CT imaging resulting in a change in therapy to Nivolumab beginning on 12/11/2014.    Lung cancer ([place])   08/04/2014 Pathology Results Diagnosis Lung, needle/core biopsy(ies), right upper lobe apical mass - POSITIVE FOR POORLY DIFFERENTIATED CARCINOMA.   08/07/2014 Treatment Plan Change 2 unit PRBC   08/14/2014 - 08/27/2014 Radiation Therapy Right upper lobe of lung to 30 Gy at 3 Gy/fraction x 10 fractions.  Dr. Pablo Ledger.   08/27/2014 Pathology Results FoundationOne- Alternations noted: MET amo, CCND1 amp, EMSY amp, FGF19 amp, FGF 3 amp, FGF 4 amp, PBRMI R95f*82, PIK3GG amp -equivocal, TP53 R273L.   09/03/2014 - 12/02/2014 Chemotherapy Xalkori for MET amplification   10/17/2014 Imaging CT chest-  Mildly decreased primary malignancy in the central right upper lobe, now 8.6 x 4.8 cm. Stable direct invasion of the anterior mediastinum by the right upper lobe mass. Slightly increased direct invasion of the right superior pulmonary vei...   10/20/2014 Procedure PleurX cath removed by IR.   12/01/2014 Progression CT chest- Increase in size of dominant right upper lobe paracentral mass compatible with progression of non-small-cell lung cancer, although there is a mixed response with  decrease in size of anterior mediastinal mass and subcarinal lymphadenopathy.   12/11/2014 -  Chemotherapy Nivolumab 240 mg   I personally reviewed and went over laboratory results with the patient.  The results are noted within this dictation.  His weight is down 3 lbs since his last weigh in.  He reports that his appetite is down.  He notes some nausea that is well controlled with home anti-emetics, but denies vomiting.  I have provided nutritional recommendations.  He is advised to maintain his current weight and/or increase his weight over the next 2 weeks.  He is agreeable to this plan.  I have recommended he take his home anti-emetic 30 - 60 minutes prior to eating if he notices that he get nauseated with eating.  I personally reviewed and went over radiographic studies with the patient.  The results are noted within this dictation.  Chest xray is reviewed.  No indication of active infection.  Pulmonary lesions are increased in size but this may be from immunotherapy that was recently started.     Past Medical History  Diagnosis Date  . [place] cancer ([facility]   . Arthritis   . Hypertension   . Allergy   . COPD (chronic obstructive pulmonary disease) ([facility]   . Diabetes mellitus without complication ([facility]   . Stroke ([facility]   . Hyperlipidemia     has Lung cancer ([facility]; Malignant ne[place] of [place] of right lung ([place]; and CVA (cerebral infarction) on his problem list.     is allergic to penicillins.  Current Outpatient Prescriptions on File Prior to Visit  Medication Sig Dispense Refill  . Alum &  Mag Hydroxide-Simeth (MAGIC MOUTHWASH W/LIDOCAINE) SOLN Swish and swallow 84m (1 teaspoonful) every 4 hours. (Patient taking differently: Take 5 mLs by mouth 4 (four) times daily as needed for mouth pain. ) 360 mL 1  . ammonium lactate (AMLACTIN) 12 % cream Apply topically as needed for dry skin.    .Marland Kitchenaspirin 81 MG tablet Take 162 mg by mouth 2 (two) times daily.     . crizotinib (XALKORI)  250 MG capsule Take 1 capsule (250 mg total) by mouth 2 (two) times daily. 60 capsule 3  . docusate sodium (EQUATE STOOL SOFTENER) 100 MG capsule Take 100 mg by mouth 2 (two) times daily as needed for mild constipation.     .Marland KitchenHYDROcodone-acetaminophen (NORCO) 10-325 MG tablet Take 1 tablet by mouth every 6 (six) hours as needed for moderate pain. 120 tablet 0  . magnesium hydroxide (MILK OF MAGNESIA) 800 MG/5ML suspension Take 15 mLs by mouth 2 (two) times daily as needed for constipation.    . metFORMIN (GLUCOPHAGE) 500 MG tablet Take 500 mg by mouth 2 (two) times daily with a meal.    . morphine (MS CONTIN) 60 MG 12 hr tablet Take 1 tablet (60 mg total) by mouth every 12 (twelve) hours. 60 tablet 0  . Nivolumab (OPDIVO IV) Inject into the vein every 14 (fourteen) days. To start 12/11/14    . ondansetron (ZOFRAN) 8 MG tablet Take 8 mg by mouth every 8 (eight) hours as needed for nausea or vomiting.     . potassium chloride SA (K-DUR,KLOR-CON) 20 MEQ tablet Take 2 tablets (40 mEq total) by mouth 2 (two) times daily. 60 tablet 0  . prochlorperazine (COMPAZINE) 10 MG tablet Take 10 mg by mouth every 6 (six) hours as needed for nausea or vomiting.     . polyethylene glycol (MIRALAX / GLYCOLAX) packet Take 17 g by mouth daily as needed for mild constipation.      No current facility-administered medications on file prior to visit.    Past Surgical History  Procedure Laterality Date  . Pleurx catheter      right side  . Peripherally inserted central catheter insertion Right 08/2014    Denies any headaches, dizziness, double vision, fevers, chills, night sweats, nausea, vomiting, diarrhea, constipation, chest pain, heart palpitations, shortness of breath, blood in stool, black tarry stool, urinary pain, urinary burning, urinary frequency, hematuria.   PHYSICAL EXAMINATION  ECOG PERFORMANCE STATUS: 1 - Symptomatic but completely ambulatory  Filed Vitals:   12/25/14 0945  BP: 108/72  Pulse: 96    Temp: 98.6 F (37 C)  Resp: 18    GENERAL:alert, no distress, well nourished, well developed, comfortable, cooperative, smiling and [place] in place for O2 delivery, in chemo-bed, and accompanied by his wife and niece. SKIN: skin color, texture, turgor are normal, no rashes or significant lesions HEAD: Normocephalic, No masses, lesions, tenderness or abnormalities EYES: normal, PERRLA, EOMI, Conjunctiva are pink and non-injected EARS: External ears normal OROPHARYNX:lips, buccal mucosa, and tongue normal and mucous membranes are moist  NECK: supple, trachea midline LYMPH:  not examined BREAST:not examined LUNGS: clear to auscultation  HEART: regular rate & rhythm, no murmurs, no gallops, S1 normal and S2 normal ABDOMEN:abdomen soft and normal bowel sounds BACK: Back symmetric, no curvature. EXTREMITIES:less then 2 second capillary refill, no joint deformities, effusion, or inflammation, no skin discoloration, no cyanosis  NEURO: alert & oriented x 3 with fluent speech, no focal motor/sensory deficits, gait normal with a walker for stability.  LABORATORY DATA: CBC    Component Value Date/Time   WBC 7.4 12/25/2014 0950   RBC 3.99* 12/25/2014 0950   HGB 9.5* 12/25/2014 0950   HCT 31.5* 12/25/2014 0950   PLT 562* 12/25/2014 0950   MCV 78.9 12/25/2014 0950   MCH 23.8* 12/25/2014 0950   MCHC 30.2 12/25/2014 0950   RDW 15.2 12/25/2014 0950   LYMPHSABS 0.6* 12/25/2014 0950   MONOABS 1.0 12/25/2014 0950   EOSABS 0.1 12/25/2014 0950   BASOSABS 0.0 12/25/2014 0950      Chemistry      Component Value Date/Time   NA 138 12/25/2014 0950   K 4.1 12/25/2014 0950   CL 106 12/25/2014 0950   CO2 25 12/25/2014 0950   BUN 11 12/25/2014 0950   CREATININE 0.90 12/25/2014 0950      Component Value Date/Time   CALCIUM 8.2* 12/25/2014 0950   ALKPHOS 130* 12/25/2014 0950   AST 36 12/25/2014 0950   ALT 25 12/25/2014 0950   BILITOT 0.5 12/25/2014 0950        PENDING  LABS:   RADIOGRAPHIC STUDIES:  Dg Chest 2 View  12/18/2014  CLINICAL DATA:  Increased shortness of breath over the past 2-3 weeks with mildly productive intermittent cough, known lung malignancy. EXAM: CHEST  2 VIEW COMPARISON:  PA and lateral chest x-ray of October 20, 2014 and chest CT scan of December 01, 2014. FINDINGS: There has been interval increase in the size of the right upper lobe mass and adjacent atelectasis. Stable apical pleural thickening is present. There is no pneumothorax or pleural effusion. The interstitial markings on the left are mildly prominent though stable. Bullous lesions in the left upper lobe are present. The cardiac silhouette is normal in size. The pulmonary vascularity is not engorged. The bony thorax exhibits no acute abnormality. IMPRESSION: Further interval increase in the size of the known right upper lobe malignancy. Surrounding atelectatic change in the perihilar region has increased. There is stable emphysematous changes within the left lung. There is no pleural effusion or pneumothorax. Electronically Signed   By: David  Martinique M.D.   On: 12/18/2014 12:01   Ct Chest W Contrast  12/01/2014  CLINICAL DATA:  Follow up right-sided lung cancer, restaging. Completed chemotherapy and radiation. Quit smoking April 2016. EXAM: CT CHEST WITH CONTRAST TECHNIQUE: Multidetector CT imaging of the chest was performed during intravenous contrast administration. CONTRAST:  7m OMNIPAQUE IOHEXOL 300 MG/ML  SOLN COMPARISON:  10/16/2014 CT, chest radiograph 10/20/2014 FINDINGS: Mediastinum/Nodes: Anterior mediastinal mass measures 3.7 x 2.3 cm image 22, decreased from previously. Heart size is normal.  Small pericardial effusion reidentified. Sub carinal centrally necrotic lymphadenopathy measuring 1.3 cm is smaller, image 33. Great vessels are normal in caliber. Mild atheromatous aortic and coronary arterial calcification. Lungs/Pleura: Right-sided chest tube has been removed. Right  upper lobe mass measuring 7.5 x 5.8 cm image 29 is increased from previously, with increased right hilar and right pulmonary vein erosion/involvement. Diffuse emphysematous changes are noted. New multilobar irregular patchy areas of airspace opacity with central bronchogram formation are new and increased since previously in the right upper lobe, right middle lobe, and superior segment right lower lobe. Subpleural nodularity is reidentified, dominant 7 mm left lower lobe pulmonary nodule identified image 53, increased from 5 mm previously. No pneumothorax. Stable degree of right greater than left pleural fluid and/or thickening. Upper abdomen: 2 mm too small to characterize hypodense lesion at the dome of the right hepatic lobe image 53 is stable. Musculoskeletal: Leftward  curvature of the thoracolumbar spine partly visualized. No lytic or sclerotic osseous lesion. Multilevel disc degenerative change noted in the spine. IMPRESSION: Increase in size of dominant right upper lobe paracentral mass compatible with progression of non-small-cell lung cancer, although there is a mixed response with decrease in size of anterior mediastinal mass and subcarinal lymphadenopathy. Pulmonary vein and hilar invasion reidentified by tumor. New multi focal right hemithoracic areas of irregular pulmonary parenchymal opacity, for which differential considerations include metastatic involvement, infection/inflammation, or less likely radiation fibrosis given lack of defined linear contour. Electronically Signed   By: Conchita Paris M.D.   On: 12/01/2014 14:31     PATHOLOGY:    ASSESSMENT AND PLAN:  Lung cancer Right upper lobe mass with right pleural effusion, initially referred to Hospice, but reporting to [facility] for second opinion regarding possible treatment options. He is S/P palliative radiation from 6/2- 08/27/2014. FoundationOne testing reveals a MET amplification and therefore, Bradley Molina was started on Xalkori on  09/03/2014.  However, in Sept 2016, he was noted to have progressive disease on CT imaging resulting in a change in therapy to Nivolumab beginning on 12/11/2014.  Oncology history updated.  Pre-chemo labs today as ordered  Treatment today pending results of labs.  His weight is down 3 lbs compared to 2 weeks ago.  I have made suggestions regarding increasing caloric intake.  I have made anti-nausea medication recommendations.  He will work on this.  He is given the goal to maintain or increase his weight over the next 2 weeks.  He is agreeable to this plan.  He is provided education regarding immunotherapy and how it works.  He is educated on signs and symptoms to be on the look-out for.  I note that his prednisone prescription says to take 50 mg if side effects of therapy are noted.  This is too low of a dose.  I have educated them that he is to take 1 mg/kg (70 mg PO daily).  I have sent a new Rx to his pharmacy in the event that he needs a refill in order to provide appropriate directions.   I have asked pharmacist to put Rx on file in the notes for the Rx.  Labs in 2 weeks.  Return in 2 weeks for for follow-up, pre-treatment labs, and treatment.  He will be restaged with scans 9 weeks out from starting Nivolumab.    THERAPY PLAN:  Continue with treatment as planned.  All questions were answered. The patient knows to call the clinic with any problems, questions or concerns. We can certainly see the patient much sooner if necessary.  Patient and plan discussed with Dr. Ancil Linsey and she is in agreement with the aforementioned.   This note is electronically signed by: Doy Mince 12/25/2014 10:44 AM

## 2014-12-25 NOTE — Progress Notes (Signed)
Patient tolerated infusion well.  VSS post infusion.

## 2014-12-25 NOTE — Assessment & Plan Note (Addendum)
Right upper lobe mass with right pleural effusion, initially referred to Hospice, but reporting to [facility] for second opinion regarding possible treatment options. He is S/P palliative radiation from 6/2- 08/27/2014. 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  However, in Sept 2016, he was noted to have progressive disease on CT imaging resulting in a change in therapy to Nivolumab beginning on 12/11/2014.  Oncology history updated.  Pre-chemo labs today as ordered  Treatment today pending results of labs.  His weight is down 3 lbs compared to 2 weeks ago.  I have made suggestions regarding increasing caloric intake.  I have made anti-nausea medication recommendations.  He will work on this.  He is given the goal to maintain or increase his weight over the next 2 weeks.  He is agreeable to this plan.  He is provided education regarding immunotherapy and how it works.  He is educated on signs and symptoms to be on the look-out for.  I note that his prednisone prescription says to take 50 mg if side effects of therapy are noted.  This is too low of a dose.  I have educated them that he is to take 1 mg/kg (70 mg PO daily).  I have sent a new Rx to his pharmacy in the event that he needs a refill in order to provide appropriate directions.   I have asked pharmacist to put Rx on file in the notes for the Rx.  Labs in 2 weeks.  Return in 2 weeks for for follow-up, pre-treatment labs, and treatment.  He will be restaged with scans 9 weeks out from starting Nivolumab.

## 2014-12-25 NOTE — Patient Instructions (Signed)
[facility] Discharge Instructions for Patients Receiving Chemotherapy  Today you received the following chemotherapy agent: Opdivo.  If you develop nausea and vomiting, or diarrhea that is not controlled by your medication, call the clinic.  The clinic phone number is [phone]. Office hours are Monday-Friday 8:30am-5:00pm.  BELOW ARE SYMPTOMS THAT SHOULD BE REPORTED IMMEDIATELY:  *FEVER GREATER THAN 101.0 F  *CHILLS WITH OR WITHOUT FEVER  NAUSEA AND VOMITING THAT IS NOT CONTROLLED WITH YOUR NAUSEA MEDICATION  *UNUSUAL SHORTNESS OF BREATH  *UNUSUAL BRUISING OR BLEEDING  TENDERNESS IN MOUTH AND THROAT WITH OR WITHOUT PRESENCE OF ULCERS  *URINARY PROBLEMS  *BOWEL PROBLEMS  UNUSUAL RASH Items with * indicate a potential emergency and should be followed up as soon as possible. If you have an emergency after office hours please contact your primary care physician or go to the nearest emergency department.  Please call the clinic during office hours if you have any questions or concerns.   You may also contact the Patient Navigator at [phone] should you have any questions or need assistance in obtaining follow up care. _____________________________________________________________________ Have you asked about our STAR program?    STAR stands for Survivorship Training and Rehabilitation, and this is a nationally recognized cancer care program that focuses on survivorship and rehabilitation.  Cancer and cancer treatments may cause problems, such as, pain, making you feel tired and keeping you from doing the things that you need or want to do. Cancer rehabilitation can help. Our goal is to reduce these troubling effects and help you have the best quality of life possible.  You may receive a survey from a nurse that asks questions about your current state of health.  Based on the survey results, all eligible patients will be referred to the [facility] program for an  evaluation so we can better serve you! A frequently asked questions sheet is available upon request.

## 2014-12-25 NOTE — Patient Instructions (Signed)
..  [place] [facility] Discharge Instructions  RECOMMENDATIONS MADE BY THE CONSULTANT AND ANY TEST RESULTS WILL BE SENT TO YOUR REFERRING PHYSICIAN.  Treatment today Labs in 2 weeks Treatment in 2 weeks New rx called in for correct dose of prednisone 70 mg-The drugstore will hold this since you already have prednisone at home FORCE food-- Use nausea medicine as needed prior to meals Glucerna as much as you can   Thank you for choosing [place] at [facility] to provide your oncology and hematology care.  To afford each patient quality time with our provider, please arrive at least 15 minutes before your scheduled appointment time.    You need to re-schedule your appointment should you arrive 10 or more minutes late.  We strive to give you quality time with our providers, and arriving late affects you and other patients whose appointments are after yours.  Also, if you no show three or more times for appointments you may be dismissed from the clinic at the providers discretion.     Again, thank you for choosing [facility].  Our hope is that these requests will decrease the amount of time that you wait before being seen by our physicians.       _____________________________________________________________  Should you have questions after your visit to [facility], please contact our office at [phone] between the hours of 8:30 a.m. and 4:30 p.m.  Voicemails left after 4:30 p.m. will not be returned until the following business day.  For prescription refill requests, have your pharmacy contact our office.

## 2014-12-26 ENCOUNTER — Encounter: Payer: Self-pay | Admitting: Dietician

## 2014-12-26 NOTE — Addendum Note (Signed)
Addended by: Baird Cancer on: 12/26/2014 03:52 PM   Modules accepted: Orders

## 2014-12-26 NOTE — Progress Notes (Signed)
Following up with pt in light of 3 lb weight loss this past month.  Contacted Pt by phone.   Wt Readings from Last 10 Encounters:  12/25/14 161 lb 9.6 oz (73.301 kg)  12/18/14 164 lb 12.8 oz (74.753 kg)  12/02/14 165 lb (74.844 kg)  11/14/14 162 lb 9.6 oz (73.755 kg)  10/17/14 165 lb 9.6 oz (75.116 kg)  10/10/14 165 lb 6.4 oz (75.025 kg)  09/26/14 164 lb 1.6 oz (74.435 kg)  09/19/14 162 lb 8 oz (73.71 kg)  09/12/14 157 lb 8 oz (71.442 kg)  09/05/14 166 lb 3.2 oz (75.388 kg)   Patient weight has decreased by a few lbs in the last month. Long term, his weight has been stable  Patient reports oral intake as fair and is suffering from symptoms including a lack of appetite and taste changed.  Pt had received glucerna case from [facility] in the past. He was educated that he is eligible for 2 more free cases which was glad to hear. Will switch to Ensure for extra kcal/pro which MD/PA agreeable to.    He notes that foods just dont taste pleasurable anymore. The tastes havent really changed, rather diminished. He states that has been mainly drinking Ensure and eating fruit.  Explained that fruit, while sweet, is not calorically dense. I would rather him drink jucie which has much more calories and less fiber. We went over many different high calorie/protein foods. He says he eats eggs every morning but it is difficult due to the taste loss.Suggested alternatives such as whole milk/breakfast meat/legumes/nuts. Will f/u at next appointment  Pt seemed to be thankful for the call.   Mailed my contact info, coupons, and handouts titled "Poor appetite" , "Taste and Smell Changes" and "Soft and Moist High Protein Menu Ideas"  Burtis Junes RD, LDN Nutrition Pager: [phone] 12/26/2014 2:29 PM

## 2014-12-29 ENCOUNTER — Other Ambulatory Visit (HOSPITAL_COMMUNITY): Payer: Self-pay | Admitting: Oncology

## 2014-12-29 MED ORDER — METFORMIN HCL 500 MG PO TABS: 500.0000 mg | ORAL_TABLET | Freq: Two times a day (BID) | ORAL | Status: AC

## 2015-01-07 NOTE — Assessment & Plan Note (Addendum)
Right upper lobe mass with right pleural effusion, initially referred to Hospice, but reporting to [facility] for second opinion regarding possible treatment options. He is S/P palliative radiation from 6/2- 08/27/2014. 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  However, in Sept 2016, he was noted to have progressive disease on CT imaging resulting in a change in therapy to Nivolumab beginning on 12/11/2014.  Oncology history is updated.  Pre-chemo labs today as ordered.  Labs today are stable.  Anemia is noted.  Not a candidate for transfusion at this time.  Renal function is stable.    Over the past 2 weeks, precipitous weight loss is noted.  He was seen by Bradley Molina this AM and provided an additional cause of ensure.  Bradley Molina and I discussed appetite stimulant choices.  Prior to seeing the patient today I considered Megace.  Corticosteroids in the setting of immunotherapy for appetite stimulation is probably not best option.  However, after seeing the patient, appetite stimulation is not likely indicated.  Bradley Molina is failing to thrive.  His weight is down yet again.  His wife reports falls without injury.  He is in a wheelchair which is unusual.  He typically uses a walker.  I discussed appetite stimulation with Megace, but this has an increased risk of VTE.  With his increased immobility, I would not consider this option.  TSH is WNL today.  He is having issues with constipation.  I have recommended BID MiraLax unless he becomes too loose.  They have tried Mag Citrate, but he had vomiting associated with that intervention.  He reports that his last BM was yesterday and it was hard.  "When was your last BM prior to yesterday?" I asked.  He reports that it may have been 3 weeks before that.  His wife is seen shaking her head indicated that his comment was not true.  He notes headaches and dizziness.  Additionally, he has fallen.   I will order a CT of head w and wo contrast to evaluate for metastatic disease.  His wife is asked to call us tomorrow with an update regarding his BMs with aforementioned intervention (MiraLax BID).  I broached Hospice.  He is not 100% receptive to this idea today.  However, he does not show me signs that he is completely against it.  "Let's work on a few things first," he says following a brief conversation regarding Hospice and comfort care.  He was referring to his appetite, weight loss, constipation, and weakness.  He requests an order for a wheelchair.  An Rx was written for a lightweight wheelchair.  Labs in 1 weeks: CBC diff, CMET.  Return in 1 week for follow-up.  Chemotherapy today is deferred and his antibody plan is manipulated to reflect that today.  If he recovers in the next week, we can restart therapy.  The patient and family are advised that if he is not improved by next week, we will need to discuss transitioning care to comfort and recruit the help of Hospice.  Everyone today is in agreement, but emotions were noted during discussions.

## 2015-01-07 NOTE — Progress Notes (Signed)
[name], MD [facility] [address] [place] [address]  Malignant neoplasm of upper lobe of right lung ([place]) - Plan: CBC with Differential, Comprehensive metabolic panel, TSH, morphine (MS CONTIN) 60 MG 12 hr tablet, HYDROcodone-acetaminophen (NORCO) 10-325 MG tablet, CT Head W Wo Contrast, CBC with Differential, Comprehensive metabolic panel  DNR (do not resuscitate)  CURRENT THERAPY: Nivolumab 240 mg IV every 2 weeks beginning on 12/11/2014.  INTERVAL HISTORY: Bradley Molina 59 y.o. male returns for followup of right upper lobe mass with right pleural effusion, initially referred to Hospice, but reporting to [facility] for second opinion regarding possible treatment options. He is S/P palliative radiation from 6/2- 08/27/2014. FoundationOne testing reveals a MET amplification and therefore, Mr. Bradley Molina was started on Xalkori on 09/03/2014.  However, in Sept 2016, he was noted to have progressive disease on CT imaging resulting in a change in therapy to Nivolumab beginning on 12/11/2014.    Lung cancer ([place])   08/04/2014 Pathology Results Diagnosis Lung, needle/core biopsy(ies), right upper lobe apical mass - POSITIVE FOR POORLY DIFFERENTIATED CARCINOMA.   08/07/2014 Treatment Plan Change 2 unit PRBC   08/14/2014 - 08/27/2014 Radiation Therapy Right upper lobe of lung to 30 Gy at 3 Gy/fraction x 10 fractions.  Dr. Pablo Ledger.   08/27/2014 Pathology Results FoundationOne- Alternations noted: MET amo, CCND1 amp, EMSY amp, FGF19 amp, FGF 3 amp, FGF 4 amp, PBRMI R978f*82, PIK3GG amp -equivocal, TP53 R273L.   09/03/2014 - 12/02/2014 Chemotherapy Xalkori for MET amplification   10/17/2014 Imaging CT chest-  Mildly decreased primary malignancy in the central right upper lobe, now 8.6 x 4.8 cm. Stable direct invasion of the anterior mediastinum by the right upper lobe mass. Slightly increased direct invasion of the right superior pulmonary vei...   10/20/2014 Procedure PleurX  cath removed by IR.   12/01/2014 Progression CT chest- Increase in size of dominant right upper lobe paracentral mass compatible with progression of non-small-cell lung cancer, although there is a mixed response with decrease in size of anterior mediastinal mass and subcarinal lymphadenopathy.   12/11/2014 -  Chemotherapy Nivolumab 240 mg   01/08/2015 Treatment Plan Change Treatment held today at APP discretion.  Failure to thrive.    I personally reviewed and went over laboratory results with the patient.  The results are noted within this dictation.  Labs are stable.  Anemia is noted.  Weight is down 7 lbs compared to 2 weeks ago and down 12 lbs compared to 12/02/2014.  He notes a decreased appetite and poor taste to foods.    He notes that he is weaker.  He is seen in a wheelchair.  He is noted to have fallen since our last discussion.  He denies any injuries.  He denies any pain.  He reports increased headaches and dizziness.  "I've lost my Joy."    I used this opportunity to broach hospice and stopping therapy to focus on symptom management.  He is not 100% receptive to this, but he does not deny it.     "Let's work on a few things."  His biggest complaint is his weakness and falling, weight loss, and constipation.  Past Medical History  Diagnosis Date  . Lung cancer ([place]   . Arthritis   . Hypertension   . Allergy   . COPD (chronic obstructive pulmonary disease) ([place]   . Diabetes mellitus without complication ([place]   . Stroke ([place]   . Hyperlipidemia   .  DNR (do not resuscitate) 01/08/2015    has Lung cancer ([place]); CVA (cerebral infarction); and DNR (do not resuscitate) on his problem list.     is allergic to penicillins.  Current Outpatient Prescriptions on File Prior to Visit  Medication Sig Dispense Refill  . Alum & Mag Hydroxide-Simeth (MAGIC MOUTHWASH W/LIDOCAINE) SOLN Swish and swallow 27m (1 teaspoonful) every 4 hours. (Patient taking differently: Take 5 mLs by mouth 4  (four) times daily as needed for mouth pain. ) 360 mL 1  . ammonium lactate (AMLACTIN) 12 % cream Apply topically as needed for dry skin.    .Marland Kitchenaspirin 81 MG tablet Take 162 mg by mouth 2 (two) times daily.     . metFORMIN (GLUCOPHAGE) 500 MG tablet Take 1 tablet (500 mg total) by mouth 2 (two) times daily with a meal. 60 tablet 1  . Nivolumab (OPDIVO IV) Inject into the vein every 14 (fourteen) days. To start 12/11/14    . ondansetron (ZOFRAN) 8 MG tablet Take 8 mg by mouth every 8 (eight) hours as needed for nausea or vomiting.     . polyethylene glycol (MIRALAX / GLYCOLAX) packet Take 17 g by mouth daily as needed for mild constipation.     . potassium chloride SA (K-DUR,KLOR-CON) 20 MEQ tablet Take 2 tablets (40 mEq total) by mouth 2 (two) times daily. 60 tablet 0  . docusate sodium (EQUATE STOOL SOFTENER) 100 MG capsule Take 100 mg by mouth 2 (two) times daily as needed for mild constipation.     . magnesium hydroxide (MILK OF MAGNESIA) 800 MG/5ML suspension Take 15 mLs by mouth 2 (two) times daily as needed for constipation.    . predniSONE (DELTASONE) 10 MG tablet Take 70 mg PO daily for side effects of Opdivo as reviewed in chemo teaching. (Patient not taking: Reported on 01/08/2015) 120 tablet 0  . prochlorperazine (COMPAZINE) 10 MG tablet Take 10 mg by mouth every 6 (six) hours as needed for nausea or vomiting.      No current facility-administered medications on file prior to visit.    Past Surgical History  Procedure Laterality Date  . Pleurx catheter      right side  . Peripherally inserted central catheter insertion Right 08/2014    Denies any double vision, fevers, chills, night sweats, nausea, vomiting, diarrhea, constipation, chest pain, heart palpitations, shortness of breath, blood in stool, black tarry stool, urinary pain, urinary burning, urinary frequency, hematuria.   PHYSICAL EXAMINATION  ECOG PERFORMANCE STATUS: 1 - Symptomatic but completely ambulatory  Filed  Vitals:   01/08/15 0954  BP: 111/65  Pulse: 67  Temp: 98.4 F (36.9 C)  Resp: 18    GENERAL:alert, no distress, well nourished, well developed, comfortable, cooperative, weight loss appreciable, looking ill and accompanied by his wife and niece.  IN [facility]: skin color, texture, turgor are normal, no rashes or significant lesions HEAD: Normocephalic, No masses, lesions, tenderness or abnormalities EYES: normal, PERRLA, EOMI, Conjunctiva are pink and non-injected EARS: External ears normal OROPHARYNX:lips, buccal mucosa, and tongue normal and mucous membranes are moist  NECK: supple, trachea midline LYMPH:  not examined BREAST:not examined LUNGS: clear to auscultation  HEART: regular rate & rhythm, no murmurs, no gallops, S1 normal and S2 normal ABDOMEN:abdomen soft and normal bowel sounds BACK: Back symmetric, no curvature. EXTREMITIES:less then 2 second capillary refill, no joint deformities, effusion, or inflammation, no skin discoloration, no cyanosis  NEURO: alert & oriented x 3 with fluent speech, no focal motor/sensory deficits,  gait normal with a walker for stability.   LABORATORY DATA: CBC    Component Value Date/Time   WBC 6.8 01/08/2015 1029   RBC 3.65* 01/08/2015 1029   HGB 8.6* 01/08/2015 1029   HCT 29.3* 01/08/2015 1029   PLT 597* 01/08/2015 1029   MCV 80.3 01/08/2015 1029   MCH 23.6* 01/08/2015 1029   MCHC 29.4* 01/08/2015 1029   RDW 15.4 01/08/2015 1029   LYMPHSABS 0.5* 01/08/2015 1029   MONOABS 0.9 01/08/2015 1029   EOSABS 0.2 01/08/2015 1029   BASOSABS 0.0 01/08/2015 1029      Chemistry      Component Value Date/Time   NA 138 01/08/2015 1029   K 4.0 01/08/2015 1029   CL 103 01/08/2015 1029   CO2 26 01/08/2015 1029   BUN 12 01/08/2015 1029   CREATININE 0.70 01/08/2015 1029      Component Value Date/Time   CALCIUM 8.3* 01/08/2015 1029   ALKPHOS 92 01/08/2015 1029   AST 30 01/08/2015 1029   ALT 17 01/08/2015 1029   BILITOT 0.6  01/08/2015 1029        PENDING LABS:   RADIOGRAPHIC STUDIES:  Dg Chest 2 View  12/18/2014  CLINICAL DATA:  Increased shortness of breath over the past 2-3 weeks with mildly productive intermittent cough, known lung malignancy. EXAM: CHEST  2 VIEW COMPARISON:  PA and lateral chest x-ray of October 20, 2014 and chest CT scan of December 01, 2014. FINDINGS: There has been interval increase in the size of the right upper lobe mass and adjacent atelectasis. Stable apical pleural thickening is present. There is no pneumothorax or pleural effusion. The interstitial markings on the left are mildly prominent though stable. Bullous lesions in the left upper lobe are present. The cardiac silhouette is normal in size. The pulmonary vascularity is not engorged. The bony thorax exhibits no acute abnormality. IMPRESSION: Further interval increase in the size of the known right upper lobe malignancy. Surrounding atelectatic change in the perihilar region has increased. There is stable emphysematous changes within the left lung. There is no pleural effusion or pneumothorax. Electronically Signed   By: David  Martinique M.D.   On: 12/18/2014 12:01     PATHOLOGY:    ASSESSMENT AND PLAN:  Lung cancer ([place]) Right upper lobe mass with right pleural effusion, initially referred to Hospice, but reporting to [facility] for second opinion regarding possible treatment options. He is S/P palliative radiation from 6/2- 08/27/2014. FoundationOne testing reveals a MET amplification and therefore, Mr. Quebedeaux was started on Xalkori on 09/03/2014.  However, in Sept 2016, he was noted to have progressive disease on CT imaging resulting in a change in therapy to Nivolumab beginning on 12/11/2014.  Oncology history is updated.  Pre-chemo labs today as ordered.  Labs today are stable.  Anemia is noted.  Not a candidate for transfusion at this time.  Renal function is stable.    Over the past 2 weeks, precipitous weight loss is noted.   He was seen by Burtis Junes this AM and provided an additional cause of ensure.  Ovid Curd and I discussed appetite stimulant choices.  Prior to seeing the patient today I considered Megace.  Corticosteroids in the setting of immunotherapy for appetite stimulation is probably not best option.  However, after seeing the patient, appetite stimulation is not likely indicated.  Mr. Enes is failing to thrive.  His weight is down yet again.  His wife reports falls without injury.  He is in a wheelchair which is  unusual.  He typically uses a walker.  I discussed appetite stimulation with Megace, but this has an increased risk of VTE.  With his increased immobility, I would not consider this option.  TSH is WNL today.  He is having issues with constipation.  I have recommended BID MiraLax unless he becomes too loose.  They have tried Mag Citrate, but he had vomiting associated with that intervention.  He reports that his last BM was yesterday and it was hard.  "When was your last BM prior to yesterday?" I asked.  He reports that it may have been 3 weeks before that.  His wife is seen shaking her head indicated that his comment was not true.  He notes headaches and dizziness.  Additionally, he has fallen.  I will order a CT of head w and wo contrast to evaluate for metastatic disease.  His wife is asked to call us tomorrow with an update regarding his BMs with aforementioned intervention (MiraLax BID).  I broached Hospice.  He is not 100% receptive to this idea today.  However, he does not show me signs that he is completely against it.  "Let's work on a few things first," he says following a brief conversation regarding Hospice and comfort care.  He was referring to his appetite, weight loss, constipation, and weakness.  He requests an order for a wheelchair.  An Rx was written for a lightweight wheelchair.  Labs in 1 weeks: CBC diff, CMET.  Return in 1 week for follow-up.  Chemotherapy today is deferred and  his antibody plan is manipulated to reflect that today.  If he recovers in the next week, we can restart therapy.  The patient and family are advised that if he is not improved by next week, we will need to discuss transitioning care to comfort and recruit the help of Hospice.  Everyone today is in agreement, but emotions were noted during discussions.   THERAPY PLAN:  Continue with treatment as planned.  All questions were answered. The patient knows to call the clinic with any problems, questions or concerns. We can certainly see the patient much sooner if necessary.  Patient and plan discussed with Dr. Ancil Linsey and she is in agreement with the aforementioned.   This note is electronically signed by: Doy Mince 01/08/2015 1:18 PM

## 2015-01-08 ENCOUNTER — Encounter (HOSPITAL_COMMUNITY): Payer: Self-pay | Admitting: Oncology

## 2015-01-08 ENCOUNTER — Encounter (HOSPITAL_BASED_OUTPATIENT_CLINIC_OR_DEPARTMENT_OTHER): Payer: Medicare Other | Admitting: Oncology

## 2015-01-08 ENCOUNTER — Encounter (HOSPITAL_COMMUNITY): Payer: Medicare Other

## 2015-01-08 ENCOUNTER — Encounter: Payer: Self-pay | Admitting: Dietician

## 2015-01-08 VITALS — BP 111/65 | HR 67 | Temp 98.4°F | Resp 18 | Wt 153.8 lb

## 2015-01-08 DIAGNOSIS — R627 Adult failure to thrive: Secondary | ICD-10-CM | POA: Diagnosis not present

## 2015-01-08 DIAGNOSIS — C3411 Malignant neoplasm of upper lobe, right bronchus or lung: Secondary | ICD-10-CM

## 2015-01-08 DIAGNOSIS — K5900 Constipation, unspecified: Secondary | ICD-10-CM | POA: Diagnosis not present

## 2015-01-08 DIAGNOSIS — R918 Other nonspecific abnormal finding of lung field: Secondary | ICD-10-CM | POA: Diagnosis not present

## 2015-01-08 DIAGNOSIS — Z66 Do not resuscitate: Secondary | ICD-10-CM

## 2015-01-08 HISTORY — DX: Do not resuscitate: Z66

## 2015-01-08 LAB — COMPREHENSIVE METABOLIC PANEL
ALBUMIN: 1.5 g/dL — AB (ref 3.5–5.0)
ALK PHOS: 92 U/L (ref 38–126)
ALT: 17 U/L (ref 17–63)
ANION GAP: 9 (ref 5–15)
AST: 30 U/L (ref 15–41)
BILIRUBIN TOTAL: 0.6 mg/dL (ref 0.3–1.2)
BUN: 12 mg/dL (ref 6–20)
CALCIUM: 8.3 mg/dL — AB (ref 8.9–10.3)
CO2: 26 mmol/L (ref 22–32)
Chloride: 103 mmol/L (ref 101–111)
Creatinine, Ser: 0.7 mg/dL (ref 0.61–1.24)
GFR calc Af Amer: >60 mL/min (ref >60)
GLUCOSE: 119 mg/dL — AB (ref 65–99)
POTASSIUM: 4 mmol/L (ref 3.5–5.1)
Sodium: 138 mmol/L (ref 135–145)
TOTAL PROTEIN: 7.6 g/dL (ref 6.5–8.1)

## 2015-01-08 LAB — CBC WITH DIFFERENTIAL/PLATELET
BASOS ABS: 0 10*3/uL (ref 0.0–0.1)
Basophils Relative: 0 %
Eosinophils Absolute: 0.2 10*3/uL (ref 0.0–0.7)
Eosinophils Relative: 3 %
HEMATOCRIT: 29.3 % — AB (ref 39.0–52.0)
HEMOGLOBIN: 8.6 g/dL — AB (ref 13.0–17.0)
LYMPHS ABS: 0.5 10*3/uL — AB (ref 0.7–4.0)
LYMPHS PCT: 8 %
MCH: 23.6 pg — ABNORMAL LOW (ref 26.0–34.0)
MCHC: 29.4 g/dL — AB (ref 30.0–36.0)
MCV: 80.3 fL (ref 78.0–100.0)
Monocytes Absolute: 0.9 10*3/uL (ref 0.1–1.0)
Monocytes Relative: 14 %
NEUTROS ABS: 5.1 10*3/uL (ref 1.7–7.7)
NEUTROS PCT: 75 %
Platelets: 597 10*3/uL — ABNORMAL HIGH (ref 150–400)
RBC: 3.65 MIL/uL — ABNORMAL LOW (ref 4.22–5.81)
RDW: 15.4 % (ref 11.5–15.5)
WBC: 6.8 10*3/uL (ref 4.0–10.5)

## 2015-01-08 LAB — TSH: TSH: 0.354 u[IU]/mL (ref 0.350–4.500)

## 2015-01-08 MED ORDER — HYDROCODONE-ACETAMINOPHEN 10-325 MG PO TABS: 1.0000 | ORAL_TABLET | Freq: Four times a day (QID) | ORAL | Status: AC | PRN

## 2015-01-08 MED ORDER — MORPHINE SULFATE ER 60 MG PO TBCR: 60.0000 mg | EXTENDED_RELEASE_TABLET | Freq: Two times a day (BID) | ORAL | Status: AC

## 2015-01-08 NOTE — Progress Notes (Signed)
Have been following pt closely. Spoke with him 2 weeks ago over phone for poor appetite, taste changes, and continuing wt loss. Had ordered ensure for him  Contacted Pt by visiting him during appointment   Wt Readings from Last 10 Encounters:  01/08/15 153 lb 12.8 oz (69.763 kg)  12/25/14 161 lb 9.6 oz (73.301 kg)  12/18/14 164 lb 12.8 oz (74.753 kg)  12/02/14 165 lb (74.844 kg)  11/14/14 162 lb 9.6 oz (73.755 kg)  10/17/14 165 lb 9.6 oz (75.116 kg)  10/10/14 165 lb 6.4 oz (75.025 kg)  09/26/14 164 lb 1.6 oz (74.435 kg)  09/19/14 162 lb 8 oz (73.71 kg)  09/12/14 157 lb 8 oz (71.442 kg)   Patient weight has significantly decreased since his last appointment. He is at his lowest weight yet  Patient reports that thingss are not going well. He appears very weak and tired. He states his oral intake is very poor. His main issues he is suffering from is taste changes.  Asked pt if he had received the letter I sent him with tips to combat taste changes. He did not recall getting this. I printed off another copy and went over recommendations. He reports not consistently maintaining oral hygiene. Commented that the salt/boking soda rinse that is on the handout is usually what helps the most, per other patients report.    Discussed with pt and family that this may be an appropriate time to try appetite stimulants. He has received multiple handouts on ways to  increase pro/cal intake yet has lost almost 10 lbs in two weeks. Advised family to discuss this with PA.   Pt seemed to be very lethargic. He did not say much.   Advised pt to contact me when he is out the case of Ensure, as he is eligible to receive one more case.   Left my contact info, coupons,  and handouts titled "Taste and Smell Changes"  Burtis Junes RD, LDN Nutrition Pager: [phone] 01/08/2015 10:15 AM

## 2015-01-08 NOTE — Patient Instructions (Signed)
..  [place] at [facility] Discharge Instructions  RECOMMENDATIONS MADE BY THE CONSULTANT AND ANY TEST RESULTS WILL BE SENT TO YOUR REFERRING PHYSICIAN.  We will schedule a CT of the head Prescription given for wheel chair We will hold your treatment for a week Take miralax twice a day Call us to let us know how your bowels are doing We are giving you refills on your pain med.  Return next Friday to see Gershon Mussel and possible treatment  Thank you for choosing [place] at [facility] to provide your oncology and hematology care.  To afford each patient quality time with our provider, please arrive at least 15 minutes before your scheduled appointment time.    You need to re-schedule your appointment should you arrive 10 or more minutes late.  We strive to give you quality time with our providers, and arriving late affects you and other patients whose appointments are after yours.  Also, if you no show three or more times for appointments you may be dismissed from the clinic at the providers discretion.     Again, thank you for choosing [facility].  Our hope is that these requests will decrease the amount of time that you wait before being seen by our physicians.       _____________________________________________________________  Should you have questions after your visit to [facility], please contact our office at [phone] between the hours of 8:30 a.m. and 4:30 p.m.  Voicemails left after 4:30 p.m. will not be returned until the following business day.  For prescription refill requests, have your pharmacy contact our office.

## 2015-01-14 ENCOUNTER — Ambulatory Visit (HOSPITAL_COMMUNITY): Payer: Medicare Other | Attending: Hematology & Oncology | Admitting: Speech Pathology

## 2015-01-14 ENCOUNTER — Other Ambulatory Visit (HOSPITAL_COMMUNITY): Payer: Self-pay | Admitting: Oncology

## 2015-01-14 ENCOUNTER — Ambulatory Visit (HOSPITAL_COMMUNITY)
Admission: RE | Admit: 2015-01-14 | Discharge: 2015-01-14 | Disposition: A | Discharge status: Home / Self Care | Payer: Medicare Other | Source: Ambulatory Visit | Attending: Hematology & Oncology | Admitting: Hematology & Oncology

## 2015-01-14 ENCOUNTER — Ambulatory Visit (HOSPITAL_COMMUNITY)
Admission: RE | Admit: 2015-01-14 | Discharge: 2015-01-14 | Disposition: A | Discharge status: Home / Self Care | Payer: Medicare Other | Source: Ambulatory Visit | Attending: Oncology | Admitting: Oncology

## 2015-01-14 DIAGNOSIS — R42 Dizziness and giddiness: Secondary | ICD-10-CM | POA: Diagnosis present

## 2015-01-14 DIAGNOSIS — R1310 Dysphagia, unspecified: Secondary | ICD-10-CM | POA: Diagnosis not present

## 2015-01-14 DIAGNOSIS — Z9221 Personal history of antineoplastic chemotherapy: Secondary | ICD-10-CM | POA: Insufficient documentation

## 2015-01-14 DIAGNOSIS — C3490 Malignant neoplasm of unspecified part of unspecified bronchus or lung: Secondary | ICD-10-CM | POA: Diagnosis not present

## 2015-01-14 DIAGNOSIS — G939 Disorder of brain, unspecified: Secondary | ICD-10-CM | POA: Diagnosis not present

## 2015-01-14 DIAGNOSIS — R51 Headache: Secondary | ICD-10-CM | POA: Insufficient documentation

## 2015-01-14 DIAGNOSIS — C3411 Malignant neoplasm of upper lobe, right bronchus or lung: Secondary | ICD-10-CM

## 2015-01-14 DIAGNOSIS — C7931 Secondary malignant neoplasm of brain: Secondary | ICD-10-CM

## 2015-01-14 DIAGNOSIS — R1319 Other dysphagia: Secondary | ICD-10-CM | POA: Diagnosis not present

## 2015-01-14 DIAGNOSIS — Z0000 Encounter for general adult medical examination without abnormal findings: Secondary | ICD-10-CM

## 2015-01-14 DIAGNOSIS — Z923 Personal history of irradiation: Secondary | ICD-10-CM | POA: Diagnosis not present

## 2015-01-14 MED ORDER — DEXAMETHASONE 4 MG PO TABS: 4.0000 mg | ORAL_TABLET | Freq: Four times a day (QID) | ORAL | Status: AC

## 2015-01-14 MED ORDER — IOHEXOL 300 MG/ML  SOLN
75.0000 mL | Freq: Once | INTRAMUSCULAR | Status: AC | PRN
  Administered 2015-01-14: 75 mL via INTRAVENOUS

## 2015-01-14 NOTE — Therapy (Signed)
[facility] [place], [place], [address] Phone: [phone]   Fax:  [phone]  Modified Barium Swallow  Patient Details  Name: Bradley Molina MRN: [id number] Date of Birth: 10-Sep-1955 No Data Recorded  Encounter Date: 01/14/2015      End of Session - 01/14/15 1407    Visit Number 1   Number of Visits 1   Authorization Type [employer] Medicare   SLP Start Time 1310   SLP Stop Time  1340   SLP Time Calculation (min) 30 min   Activity Tolerance Patient tolerated treatment well      Past Medical History  Diagnosis Date  . Lung cancer ([place])   . Arthritis   . Hypertension   . Allergy   . COPD (chronic obstructive pulmonary disease) ([place])   . Diabetes mellitus without complication ([place])   . Stroke ([place])   . Hyperlipidemia   . DNR (do not resuscitate) 01/08/2015    Past Surgical History  Procedure Laterality Date  . Pleurx catheter      right side  . Peripherally inserted central catheter insertion Right 08/2014    There were no vitals filed for this visit.  Visit Diagnosis: Dysphagia      Subjective Assessment - 01/14/15 1401    Subjective "I just don't have an appetite."   Special Tests MBSS   Currently in Pain? No/denies             General - 01/14/15 1401    General Information   Date of Onset 12/18/14   Other Pertinent Information Mr. Bradley Molina is a 59 yo male who was referred by Dr. Whitney Muse for MBSS due to significant weight loss in setting of lung CA.    Type of Study Other (Comment)  MBSS   Reason for Referral Objectively evaluate swallowing function   Previous Swallow Assessment None on record   Diet Prior to this Study Regular;Thin liquids   Temperature Spikes Noted No   Respiratory Status Room air   History of Recent Intubation No   Behavior/Cognition Alert;Cooperative;Pleasant mood   Oral Cavity - Dentition Adequate natural dentition/normal for age  missing a few teeth   Oral  Motor / Sensory Function Impaired  left oral weakness   Self-Feeding Abilities Able to feed self   Patient Positioning Upright in chair/Tumbleform   Baseline Vocal Quality Hoarse;Breathy;Low vocal intensity;Wet   Volitional Cough Weak;Congested   Volitional Swallow Able to elicit  very delayed   Anatomy Within functional limits   Pharyngeal Secretions Not observed secondary MBS            Oral Preparation/Oral Phase - 01/14/15 1404    Oral Preparation/Oral Phase   Oral Phase Impaired   Oral - Nectar   Oral - Nectar Cup Within functional limits   Oral - Thin   Oral - Thin Cup Within functional limits   Oral - Thin Straw Within functional limits   Oral - Solids   Oral - Puree Within functional limits   Oral - Regular Weak ligual manipulation;Delayed A-P transit   Oral - Pill Within functional limits   Electrical stimulation - Oral Phase   Was Electrical Stimulation Used No          Pharyngeal Phase - 01/14/15 1405    Pharyngeal Phase   Pharyngeal Phase Impaired   Pharyngeal - Nectar   Pharyngeal- Nectar Cup Swallow initiation at pyriform sinus;Delayed swallow initiation;Pharyngeal residue - valleculae   Pharyngeal - Thin  Pharyngeal- Thin Cup Delayed swallow initiation;Pharyngeal residue - valleculae;Penetration/Aspiration before swallow;Penetration/Aspiration during swallow   Pharyngeal- Thin Straw Delayed swallow initiation;Pharyngeal residue - valleculae;Penetration/Aspiration before swallow;Penetration/Aspiration during swallow   Pharyngeal - Solids   Pharyngeal- Puree Delayed swallow initiation   Pharyngeal- Mechanical Soft Delayed swallow initiation   Electrical Stimulation - Pharyngeal Phase   Was Electrical Stimulation Used No          Cricopharyngeal Phase - 01/22/2015 1406    Cervical Esophageal Phase   Cervical Esophageal Phase Within functional limits  pill transiently delayed in mid esophagus                  Plan - 01/22/15 1408     Clinical Impression Statement Pt presents with mild oral phase dysphagia and moderate pharyngeal phase dysphagia characterized by mildly prolonged oral phase likely due to lingual weakness, mod/severe delay in swallow initiation with swallow trigger after spilling from valleculae to pyriforms with puree (over 8 seconds) and after completely filling pyriforms and spilling into laryngeal vestibule with thins and nectars resulting in consistent penetration with thins and nectars (less so with nectars) and variable mild/mod residuals post swallow with liquids (valleculae, laryngeal vestibule, lateral channels, pyriforms with liquids). Surprisingly, aspiration below the cords was only observed on one occasion and it was expelled back into laryngeal vestibule. Pt has a very weak, congested cough. Pt's vocal quality sounded wet and breathy prior to start of study and he reports difficulty clearing his secretions. Given his significant delay in swallow initiation (pt seems aware and visibly struggles to initiate a swallow as quickly as he can) and very weak cough (poor respiratory support), pt is at risk for aspiration on all liquids. Pt is unable to tell this SLP if he participated in dysphagia treatment at [facility] (he states he had a stroke a few months ago there) or with home health. Pt may benefit from short term dysphagia therapy for pt/caregiver education; diet tolerance, swallowing exercises, and for training of compensatory strategies. Pt has lost a significant amount of weight per chart review which pt attributes to lack of appetite. I suspect fatigue also plays a role. SLP will reach out to SLP with home health to see if she has worked with pt before. Would recommend D3(soft diet) for energy conservation with downgrade to puree when very fatigued. Pt would likely be safer with nectar-thick liquids, but given his lack of intake as it is, we may need to continue with thin liquids. Again, this will be addressed via  home health or outpatient.     Consulted and Agree with Plan of Care Patient          G-Codes - 2015/01/22 1408    Functional Assessment Tool Used MBSS; clinical judgment   Functional Limitations Swallowing   Swallow Current Status (I4580) At least 20 percent but less than 40 percent impaired, limited or restricted   Swallow Goal Status (D9833) At least 20 percent but less than 40 percent impaired, limited or restricted   Swallow Discharge Status [phone]) At least 20 percent but less than 40 percent impaired, limited or restricted          Recommendations/Treatment - 01-22-15 1406    Swallow Evaluation Recommendations   SLP Diet Recommendations Dysphagia 3 (Mech soft);Thin;Nectar   Liquid Administration via [employer];No straw   Medication Administration Whole meds with liquid   Supervision Patient able to self feed   Compensations Small sips/bites;Multiple dry swallows after each bite/sip;Clear throat intermittently   Postural Changes  Remain semi-upright after after feeds/meals (Comment);Seated upright at 90 degrees          Prognosis - 01/14/15 1407    Prognosis   Prognosis for Safe Diet Advancement Fair   Barriers to Reach Goals Severity of deficits   Individuals Consulted   Consulted and Agree with Results and Recommendations Patient   Report Sent to  Referring physician      Problem List Patient Active Problem List   Diagnosis Date Noted  . DNR (do not resuscitate) 01/08/2015  . CVA (cerebral infarction) 12/18/2014  . Lung cancer [facility]) 07/31/2014   Thank you,  Genene Churn, [place]  Brady 01/14/2015, 2:09 PM  [place], [place], [address] Phone: [phone]   Fax:  [phone]  Name: Niam Nepomuceno MRN: [id number] Date of Birth: [birth year]

## 2015-01-16 ENCOUNTER — Encounter (HOSPITAL_COMMUNITY): Payer: Self-pay | Admitting: Lab

## 2015-01-16 ENCOUNTER — Ambulatory Visit (HOSPITAL_COMMUNITY)
Admission: RE | Admit: 2015-01-16 | Discharge: 2015-01-16 | Disposition: A | Discharge status: Home / Self Care | Payer: Medicare Other | Source: Ambulatory Visit | Attending: Oncology | Admitting: Oncology

## 2015-01-16 ENCOUNTER — Encounter (HOSPITAL_COMMUNITY): Payer: Medicare Other | Attending: Oncology | Admitting: Oncology

## 2015-01-16 ENCOUNTER — Encounter (HOSPITAL_COMMUNITY): Payer: Self-pay | Admitting: Oncology

## 2015-01-16 ENCOUNTER — Encounter (HOSPITAL_BASED_OUTPATIENT_CLINIC_OR_DEPARTMENT_OTHER): Payer: Medicare Other

## 2015-01-16 ENCOUNTER — Encounter (HOSPITAL_COMMUNITY): Payer: Medicare Other

## 2015-01-16 VITALS — BP 125/91 | HR 55 | Temp 97.8°F | Resp 16 | Wt 158.5 lb

## 2015-01-16 DIAGNOSIS — C3411 Malignant neoplasm of upper lobe, right bronchus or lung: Secondary | ICD-10-CM | POA: Diagnosis not present

## 2015-01-16 DIAGNOSIS — R9089 Other abnormal findings on diagnostic imaging of central nervous system: Secondary | ICD-10-CM | POA: Insufficient documentation

## 2015-01-16 DIAGNOSIS — G939 Disorder of brain, unspecified: Secondary | ICD-10-CM

## 2015-01-16 DIAGNOSIS — C3490 Malignant neoplasm of unspecified part of unspecified bronchus or lung: Secondary | ICD-10-CM | POA: Insufficient documentation

## 2015-01-16 DIAGNOSIS — R918 Other nonspecific abnormal finding of lung field: Secondary | ICD-10-CM | POA: Diagnosis not present

## 2015-01-16 DIAGNOSIS — J90 Pleural effusion, not elsewhere classified: Secondary | ICD-10-CM | POA: Diagnosis not present

## 2015-01-16 LAB — COMPREHENSIVE METABOLIC PANEL
ALK PHOS: 90 U/L (ref 38–126)
ALT: 17 U/L (ref 17–63)
AST: 26 U/L (ref 15–41)
Albumin: 1.5 g/dL — ABNORMAL LOW (ref 3.5–5.0)
Anion gap: 7 (ref 5–15)
BUN: 23 mg/dL — ABNORMAL HIGH (ref 6–20)
CALCIUM: 8.7 mg/dL — AB (ref 8.9–10.3)
CO2: 28 mmol/L (ref 22–32)
CREATININE: 0.77 mg/dL (ref 0.61–1.24)
Chloride: 105 mmol/L (ref 101–111)
Glucose, Bld: 158 mg/dL — ABNORMAL HIGH (ref 65–99)
Potassium: 5.6 mmol/L — ABNORMAL HIGH (ref 3.5–5.1)
Sodium: 140 mmol/L (ref 135–145)
Total Bilirubin: 0.5 mg/dL (ref 0.3–1.2)
Total Protein: 7.5 g/dL (ref 6.5–8.1)

## 2015-01-16 LAB — CBC WITH DIFFERENTIAL/PLATELET
Basophils Absolute: 0 10*3/uL (ref 0.0–0.1)
Basophils Relative: 0 %
EOS ABS: 0 10*3/uL (ref 0.0–0.7)
EOS PCT: 0 %
HCT: 28.3 % — ABNORMAL LOW (ref 39.0–52.0)
HEMOGLOBIN: 8.3 g/dL — AB (ref 13.0–17.0)
LYMPHS ABS: 1.4 10*3/uL (ref 0.7–4.0)
Lymphocytes Relative: 8 %
MCH: 23.8 pg — AB (ref 26.0–34.0)
MCHC: 29.3 g/dL — ABNORMAL LOW (ref 30.0–36.0)
MCV: 81.1 fL (ref 78.0–100.0)
MONO ABS: 0.7 10*3/uL (ref 0.1–1.0)
MONOS PCT: 4 %
Neutro Abs: 14.8 10*3/uL — ABNORMAL HIGH (ref 1.7–7.7)
Neutrophils Relative %: 88 %
Platelets: 693 10*3/uL — ABNORMAL HIGH (ref 150–400)
RBC: 3.49 MIL/uL — AB (ref 4.22–5.81)
RDW: 15.6 % — ABNORMAL HIGH (ref 11.5–15.5)
WBC: 16.9 10*3/uL — ABNORMAL HIGH (ref 4.0–10.5)

## 2015-01-16 MED ORDER — GADOBENATE DIMEGLUMINE 529 MG/ML IV SOLN
14.0000 mL | Freq: Once | INTRAVENOUS | Status: AC | PRN
  Administered 2015-01-16: 14 mL via INTRAVENOUS

## 2015-01-16 NOTE — Progress Notes (Signed)
Bradley Dan, MD [facility] [address] [place] [address]  Malignant neoplasm of upper lobe of right lung [facility]) - Plan: MR Brain W Wo Contrast  CURRENT THERAPY: Nivolumab on hold.  INTERVAL HISTORY: Bradley Molina 59 y.o. male returns for followup of right upper lobe mass with right pleural effusion, initially referred to Hospice, but reporting to [facility] for second opinion regarding possible treatment options. He is S/P palliative radiation from 6/2- 08/27/2014. 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 However, in Sept 2016, he was noted to have progressive disease on CT imaging resulting in a change in therapy to Nivolumab beginning on 12/11/2014.    Lung cancer ([place])   08/04/2014 Pathology Results Diagnosis Lung, needle/core biopsy(ies), right upper lobe apical mass - POSITIVE FOR POORLY DIFFERENTIATED CARCINOMA.   08/07/2014 Treatment Plan Change 2 unit PRBC   08/14/2014 - 08/27/2014 Radiation Therapy Right upper lobe of lung to 30 Gy at 3 Gy/fraction x 10 fractions.  Dr. Pablo Ledger.   08/27/2014 Pathology Results FoundationOne- Alternations noted: MET amo, CCND1 amp, EMSY amp, FGF19 amp, FGF 3 amp, FGF 4 amp, PBRMI R943f*82, PIK3GG amp -equivocal, TP53 R273L.   09/03/2014 - 12/02/2014 Chemotherapy Xalkori for MET amplification   10/17/2014 Imaging CT chest-  Mildly decreased primary malignancy in the central right upper lobe, now 8.6 x 4.8 cm. Stable direct invasion of the anterior mediastinum by the right upper lobe mass. Slightly increased direct invasion of the right superior pulmonary vei...   10/20/2014 Procedure PleurX cath removed by IR.   12/01/2014 Progression CT chest- Increase in size of dominant right upper lobe paracentral mass compatible with progression of non-small-cell lung cancer, although there is a mixed response with decrease in size of anterior mediastinal mass and  subcarinal lymphadenopathy.   12/11/2014 -  Chemotherapy Nivolumab 240 mg   01/08/2015 Treatment Plan Change Treatment held today at APP discretion.  Failure to thrive.   01/14/2015 Progression CT head- 2.9 x 2.9 by at least 2.4 cm heterogeneous enhancing mass lesions centered within the pons compatible with a focal metastasis. There is significant surrounding vasogenic edema.   01/14/2015 Treatment Plan Change Decadron [id number]    I personally reviewed and went over radiographic studies with the patient.  The results are noted within this dictation.  Ct demonstrates ~ 3 cm pons lesion with edema.  I started him on high dose Decadron on Wednesday.  Dr. KiSondra Comeas paged yesterday (Rad Onc) and follow-up discussion with Dr. MaTammi KlippelRaDelavantook place for consideration of SRS therapy.  Last night, Dr. MaTammi Klippeleported that he would discuss the case with neurosurgery and requested an MRI of brain for further evaluation.  This order is placed STAT.  STAT MRI further evaluated intracranial lesion.  This was reviewed by Dr. MaTammi Klippelnd he I discussed the case.    I had a frank conversation with Mr. ScMcnellisnd his wife as he appreciates this approach.  We discussed the situation and future options.  After reviewing the options, I excused myself to allow him to make a phone call to his preacher.  After about 30 minutes, his wife came and got me.  We re-convened in the exam room.  He reports that he does not want to pursue radiation therapy.  As a result, I discussed Hospice care.  His wife reports, "Daivik, you need to let them in this time."  We  had a long conversation regarding Hospice.  Patient education was given regarding Hospice and the services they provide.  The patient understands that studies report that early enrollment in Hospice actually allows the patient to live longer compared to those who enroll in Hospice nearer to end of life.  Hospice will allow the patient to stay at home at end of life or  go to a facility for end of life care.  At this point, the patient would like to remain at home.  Hospice provides the patient with a team of providers to help with care including physicians, nurses, aids, chaplains, and social workers.  Hospice's goal is to manage symptoms at home. The patient is certainly Hospice appropriate with a life expectancy of less than 6 months, closer to days/weeks.  The patient has declined active therapy at this time.   Past Medical History  Diagnosis Date  . Lung cancer ([place])   . Arthritis   . Hypertension   . Allergy   . COPD (chronic obstructive pulmonary disease) ([place])   . Diabetes mellitus without complication ([place])   . Stroke ([place])   . Hyperlipidemia   . DNR (do not resuscitate) 01/08/2015    has Lung cancer ([place]); CVA (cerebral infarction); and DNR (do not resuscitate) on his problem list.     is allergic to penicillins.  Current Outpatient Prescriptions on File Prior to Visit  Medication Sig Dispense Refill  . Alum & Mag Hydroxide-Simeth (MAGIC MOUTHWASH W/LIDOCAINE) SOLN Swish and swallow 8m (1 teaspoonful) every 4 hours. (Patient taking differently: Take 5 mLs by mouth 4 (four) times daily as needed for mouth pain. ) 360 mL 1  . ammonium lactate (AMLACTIN) 12 % cream Apply topically as needed for dry skin.    .Marland Kitchenaspirin 81 MG tablet Take 162 mg by mouth 2 (two) times daily.     .Marland Kitchendexamethasone (DECADRON) 4 MG tablet Take 1 tablet (4 mg total) by mouth 4 (four) times daily. 60 tablet 0  . docusate sodium (EQUATE STOOL SOFTENER) 100 MG capsule Take 100 mg by mouth 2 (two) times daily as needed for mild constipation.     .Marland KitchenHYDROcodone-acetaminophen (NORCO) 10-325 MG tablet Take 1 tablet by mouth every 6 (six) hours as needed for moderate pain. 120 tablet 0  . magnesium hydroxide (MILK OF MAGNESIA) 800 MG/5ML suspension Take 15 mLs by mouth 2 (two) times daily as needed for constipation.    . metFORMIN (GLUCOPHAGE) 500 MG tablet Take 1 tablet (500 mg  total) by mouth 2 (two) times daily with a meal. 60 tablet 1  . morphine (MS CONTIN) 60 MG 12 hr tablet Take 1 tablet (60 mg total) by mouth every 12 (twelve) hours. 60 tablet 0  . Nivolumab (OPDIVO IV) Inject into the vein every 14 (fourteen) days. To start 12/11/14    . ondansetron (ZOFRAN) 8 MG tablet Take 8 mg by mouth every 8 (eight) hours as needed for nausea or vomiting.     . polyethylene glycol (MIRALAX / GLYCOLAX) packet Take 17 g by mouth daily as needed for mild constipation.     . potassium chloride SA (K-DUR,KLOR-CON) 20 MEQ tablet Take 2 tablets (40 mEq total) by mouth 2 (two) times daily. 60 tablet 0  . prochlorperazine (COMPAZINE) 10 MG tablet Take 10 mg by mouth every 6 (six) hours as needed for nausea or vomiting.      No current facility-administered medications on file prior to visit.    Past Surgical History  Procedure Laterality Date  . Pleurx catheter      right side  . Peripherally inserted central catheter insertion Right 08/2014    Denies any headaches, fevers, chills, night sweats, nausea, vomiting, diarrhea, chest pain, heart palpitations, shortness of breath, blood in stool, black tarry stool, urinary pain, urinary burning, urinary frequency, hematuria.   PHYSICAL EXAMINATION  ECOG PERFORMANCE STATUS: 3 - Symptomatic, >50% confined to bed  Filed Vitals:   01/16/15 0915  BP: 125/91  Pulse: 55  Temp: 97.8 F (36.6 C)  Resp: 16    GENERAL:alert, no distress, cachectic, ill looking and accompanied by wife SKIN: skin color, texture, turgor are normal, no rashes or significant lesions HEAD: Normocephalic, No masses, lesions, tenderness or abnormalities NECK: supple, trachea midline LYMPH:  not examined BREAST:not examined EXTREMITIES:less then 2 second capillary refill, no joint deformities, effusion, or inflammation, no skin discoloration  NEURO: alert & oriented x 3 with fluent speech   LABORATORY DATA: CBC    Component Value Date/Time   WBC  16.9* 01/16/2015 1117   RBC 3.49* 01/16/2015 1117   HGB 8.3* 01/16/2015 1117   HCT 28.3* 01/16/2015 1117   PLT 693* 01/16/2015 1117   MCV 81.1 01/16/2015 1117   MCH 23.8* 01/16/2015 1117   MCHC 29.3* 01/16/2015 1117   RDW 15.6* 01/16/2015 1117   LYMPHSABS 1.4 01/16/2015 1117   MONOABS 0.7 01/16/2015 1117   EOSABS 0.0 01/16/2015 1117   BASOSABS 0.0 01/16/2015 1117      Chemistry      Component Value Date/Time   NA 140 01/16/2015 1117   K 5.6* 01/16/2015 1117   CL 105 01/16/2015 1117   CO2 28 01/16/2015 1117   BUN 23* 01/16/2015 1117   CREATININE 0.77 01/16/2015 1117      Component Value Date/Time   CALCIUM 8.7* 01/16/2015 1117   ALKPHOS 90 01/16/2015 1117   AST 26 01/16/2015 1117   ALT 17 01/16/2015 1117   BILITOT 0.5 01/16/2015 1117        PENDING LABS:   RADIOGRAPHIC STUDIES:  Dg Chest 2 View  12/18/2014  CLINICAL DATA:  Increased shortness of breath over the past 2-3 weeks with mildly productive intermittent cough, known lung malignancy. EXAM: CHEST  2 VIEW COMPARISON:  PA and lateral chest x-ray of October 20, 2014 and chest CT scan of December 01, 2014. FINDINGS: There has been interval increase in the size of the right upper lobe mass and adjacent atelectasis. Stable apical pleural thickening is present. There is no pneumothorax or pleural effusion. The interstitial markings on the left are mildly prominent though stable. Bullous lesions in the left upper lobe are present. The cardiac silhouette is normal in size. The pulmonary vascularity is not engorged. The bony thorax exhibits no acute abnormality. IMPRESSION: Further interval increase in the size of the known right upper lobe malignancy. Surrounding atelectatic change in the perihilar region has increased. There is stable emphysematous changes within the left lung. There is no pleural effusion or pneumothorax. Electronically Signed   By: David  Martinique M.D.   On: 12/18/2014 12:01   Ct Head W Wo  Contrast  01/14/2015  CLINICAL DATA:  Lung cancer. Dizziness and occasional headaches for 1 month. EXAM: CT HEAD WITHOUT AND WITH CONTRAST TECHNIQUE: Contiguous axial images were obtained from the base of the skull through the vertex without and with intravenous contrast CONTRAST:  41m OMNIPAQUE IOHEXOL 300 MG/ML  SOLN COMPARISON:  CT head without contrast 06/30/2014 FINDINGS: A heterogeneous enhancing mass lesion  is centered within the pons lesion measures 2.9 x 2.9 x over 2.4 cm. There is significant surrounding vasogenic edema extending into the right cerebellar peduncle and through the right cerebral peduncle into the inferior right thalamus. The basal ganglia are otherwise intact. No other focal enhancing lesions are evident. The ventricles are of normal size. No significant extra-axial fluid collection is present. The paranasal sinuses and mastoid air cells are clear. IMPRESSION: 1. 2.9 x 2.9 by at least 2.4 cm heterogeneous enhancing mass lesions centered within the pons compatible with a focal metastasis. There is significant surrounding vasogenic edema. 2. No other definite focal enhancement. 3. Recommend MRI of the brain without and with contrast for further evaluation of this lesion and other possible metastases. These results will be called to the ordering clinician or representative by the Radiologist Assistant, and communication documented in the PACS or zVision Dashboard. Electronically Signed   By: San Morelle M.D.   On: 01/14/2015 14:54   Mr Jeri Cos TD Contrast  01/16/2015  ADDENDUM REPORT: 01/16/2015 11:13 ADDENDUM: Critical Value/emergent results were called by telephone at the time of interpretation on 01/16/2015 at 11:10 am to Dr. Robynn Pane , who verbally acknowledged these results. Electronically Signed   By: Staci Righter M.D.   On: 01/16/2015 11:13  01/16/2015  CLINICAL DATA:  Lung cancer.  Abnormal brainstem appearance on CT. EXAM: MRI HEAD WITHOUT AND WITH CONTRAST  TECHNIQUE: Multiplanar, multiecho pulse sequences of the brain and surrounding structures were obtained without and with intravenous contrast. CONTRAST:  41m MULTIHANCE GADOBENATE DIMEGLUMINE 529 MG/ML IV SOLN COMPARISON:  CT head 01/14/2015. Also CT head without contrast 06/30/2014. FINDINGS: No acute stroke, hydrocephalus, or extra-axial fluid. Premature for age atrophy. Chronic microvascular ischemic change. Within the pons, there is an expansile lesion with surrounding vasogenic edema, peripheral restricted diffusion, central necrosis, and postcontrast rim enhancement. The lesions spreads into the RIGHT cerebellum, and expands outward into the RIGHT cerebellopontine angle and pre pontine cistern. The lesion is septated, but there is no significant intratumoral hemorrhage. Basilar artery is displaced RIGHT to LEFT. The lesion measures 35 x 30 x 30 mm (R-L x A-P x C-C). The lesion appears solitary. There is effacement of the aqueduct of Sylvius, and the patient is at risk for hydrocephalus. Normal pituitary and cerebellar tonsils. Extracranial soft tissues are unremarkable. No vascular occlusion. Compared with CT from 11/02, the appearance is similar. IMPRESSION: 35 x 30 x 30 mm rim enhancing intra-axial pontine lesion, with regional expansion, consistent with a solitary metastasis from lung cancer. Primary brainstem glioma is less favored. There is effacement of the aqueduct of Sylvius, and the patient is at risk for hydrocephalus. Mild atrophy with moderately extensive small vessel disease. Electronically Signed: By: JStaci RighterM.D. On: 01/16/2015 11:06   Dg Swallowing Func-speech Pathology  01/14/2015  CLINICAL DATA:  Lung cancer post chemotherapy and radiation therapy, weight loss, stroke EXAM: MODIFIED BARIUM SWALLOW TECHNIQUE: Different consistencies of barium were administered orally to the patient by the Speech Pathologist. Imaging of the pharynx was performed in the lateral projection. FLUOROSCOPY  TIME:  Radiation Exposure Index (as provided by the fluoroscopic device): Not provided If the device does not provide the exposure index: Fluoroscopy Time:  3 minutes 54 seconds Number of Acquired Images:  2 screen captures during fluoroscopy COMPARISON:  None FINDINGS: Thin liquid- by cup, uKoreapremature spillover of contrast to the valleculae is seen. Significant laryngeal penetration. No aspiration initially. With thin barium by straw, premature spillover of contrast  to the vallecular was again seen with delayed initiation, laryngeal penetration and aspiration of contrast below the vocal cords. With multiple swallows, retained contrast was identified in the linear fashion within the larynx, question lying upon the false cords. Nectar thick liquid- by cup, premature spillover of contrast noted with delayed initiation. Laryngeal penetration seen without aspiration. Vallecular residuals noted. Honey- not evaluated Pure- with applesauce, delayed oral transit. Significant premature spillover of contrast to the vallecula and piriform sinuses. Markedly delayed initiation of swallowing. No laryngeal penetration or aspiration. Minimal piriform sinus residual. Cracker-delayed initiation.  No laryngeal penetration or aspiration. Pure with cracker- not evaluated Barium tablet - swallowed with thin barium. Delayed initiation after premature spillover to the vallecula. Laryngeal penetration without aspiration. IMPRESSION: Swelling dysfunction as above. Please refer to the Speech Pathologists report for complete details and recommendations. Electronically Signed   By: Lavonia Dana M.D.   On: 01/14/2015 17:50     PATHOLOGY:    ASSESSMENT AND PLAN:  Lung cancer ([place]) Right upper lobe mass with right pleural effusion, initially referred to Hospice, but reporting to [facility] for second opinion regarding possible treatment options. He is S/P palliative radiation from 6/2- 08/27/2014. [facility]One testing reveals a MET  amplification and therefore, Bradley Molina was started on Xalkori on 09/03/2014.  However, in Sept 2016, he was noted to have progressive disease on CT imaging resulting in a change in therapy to Nivolumab beginning on 12/11/2014.  Oncology history is updated.  DNR  Please see last encounter 1 week ago for details.  Recent CT imaging of brain reveals a large Pons lesion measuring almost 3 cm.  This lesion is associated with edema.  Bradley Molina was started on Decadron 8 mg TID and Rad Onc was notified.    MRI brain STAT today w and wo contrast for further evaluation of lesion and to evaluate for other sites of disease intracranially.  Labs today: CBC diff, CMET.    Treatment discontinued today.  Referral to Hospice.    THERAPY PLAN:  D/C treatment.  Refer to Hospice.  All questions were answered. The patient knows to call the clinic with any problems, questions or concerns. We can certainly see the patient much sooner if necessary.  Patient and plan discussed with Dr. Ancil Linsey and she is in agreement with the aforementioned.   This note is electronically signed by: Doy Mince 01/16/2015 4:28 PM

## 2015-01-16 NOTE — Assessment & Plan Note (Addendum)
Right upper lobe mass with right pleural effusion, initially referred to Hospice, but reporting to [facility] for second opinion regarding possible treatment options. He is S/P palliative radiation from 6/2- 08/27/2014. 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  However, in Sept 2016, he was noted to have progressive disease on CT imaging resulting in a change in therapy to Nivolumab beginning on 12/11/2014.  Oncology history is updated.  DNR  Please see last encounter 1 week ago for details.  Recent CT imaging of brain reveals a large Pons lesion measuring almost 3 cm.  This lesion is associated with edema.  Bradley Molina was started on Decadron 8 mg TID and Rad Onc was notified.    MRI brain STAT today w and wo contrast for further evaluation of lesion and to evaluate for other sites of disease intracranially.  Labs today: CBC diff, CMET.    Treatment discontinued today.  Referral to Hospice.

## 2015-01-16 NOTE — Progress Notes (Signed)
Referral sent to Hospice on 11/4

## 2015-01-16 NOTE — Patient Instructions (Addendum)
[place] at [facility] Discharge Instructions  RECOMMENDATIONS MADE BY THE CONSULTANT AND ANY TEST RESULTS WILL BE SENT TO YOUR REFERRING PHYSICIAN.  Hospice Referral to [facility]  Phone # [phone]   Thank you for choosing [place] at [facility] to provide your oncology and hematology care.  To afford each patient quality time with our provider, please arrive at least 15 minutes before your scheduled appointment time.    You need to re-schedule your appointment should you arrive 10 or more minutes late.  We strive to give you quality time with our providers, and arriving late affects you and other patients whose appointments are after yours.  Also, if you no show three or more times for appointments you may be dismissed from the clinic at the providers discretion.     Again, thank you for choosing [facility]  Our hope is that these requests will decrease the amount of time that you wait before being seen by our physicians.       _____________________________________________________________  Should you have questions after your visit to [facility], please contact our office at [phone] between the hours of 8:30 a.m. and 4:30 p.m.  Voicemails left after 4:30 p.m. will not be returned until the following business day.  For prescription refill requests, have your pharmacy contact our office.

## 2015-01-16 NOTE — Progress Notes (Signed)
Please see doctors encounter for more information  No treatment today

## 2015-01-19 NOTE — Progress Notes (Signed)
LABS DRAWN

## 2015-01-21 NOTE — Progress Notes (Signed)
[place] at [facility] Progress Note   CHIEF COMPLAINTS/PURPOSE OF CONSULTATION:  Second opinion on: Lung cancer, no biopsy Recent CVA 06/30/14 Admitted at [facility]. He presented with left sided weakness and slurred speech Anemia Pleurx catheter placement in right lung, malignant plural effusion MET amplification XALKORI Progression, on Nivolumab 12/11/2014 MRI brain 01/16/2015 with 35x30x30 rim enhancing intra-axial pontine lesion, with regional expansion, effacement of the aqueduct of Sylvius  HISTORY OF PRESENTING ILLNESS:  Bradley Molina 59 y.o. male is here for follow up . 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 He was on XALKORI, unfortunately progressed was on Nivolumab. He developed worsening decline with CNS symptoms and brain imaging showing an intra-axial pontine lesion. He is rapidly declining.  He is here today with family.  Hospice cam out to the house but the patient did not accept their care because he though that meant he could not come to the office to see [place] at all.  He is here today with family including his wife.   His wife notes he is unable to do any ADL's without assistance. Confusion is worsening. They are here today for further consultation.    MEDICAL HISTORY:  Past Medical History  Diagnosis Date  . Lung cancer ([place])   . Arthritis   . Hypertension   . Allergy   . COPD (chronic obstructive pulmonary disease) ([place])   . Diabetes mellitus without complication ([place])   . Stroke ([place])   . Hyperlipidemia   . DNR (do not resuscitate) 01/08/2015    SURGICAL HISTORY: Past Surgical History  Procedure Laterality Date  . Pleurx catheter      right  side  . Peripherally inserted central catheter insertion Right 08/2014    SOCIAL HISTORY: Social History   Social History  . Marital Status: Married    Spouse Name: N/A  . Number of Children: N/A  . Years of Education: N/A   Occupational History  . Not on file.   Social History Main Topics  . Smoking status: Former Smoker -- 1.00 packs/day for 40 years    Types: Cigarettes    Quit date: 06/17/2014  . Smokeless tobacco: Never Used  . Alcohol Use: No  . Drug Use: No  . Sexual Activity: Not on file   Other Topics Concern  . Not on file   Social History Narrative  Married for 25 years with 3 children and 7 grandchildren He worked with furniture Former smoker; quit last month. Prior 40 to 50 pack year history Former drinker  FAMILY HISTORY: Family History  Problem Relation Age of Onset  . Cancer Mother   . COPD Mother   . Emphysema Father    indicated that his mother is deceased. He indicated that his father is deceased.  Mother was in early 58s when she died of lung cancer. Father died of emphysema. His brother died.  3 living sisters, 2 other sisters died.  ALLERGIES:  is allergic to penicillins.  MEDICATIONS:  Current Outpatient Prescriptions  Medication Sig Dispense Refill  . Alum & Mag Hydroxide-Simeth (MAGIC MOUTHWASH W/LIDOCAINE) SOLN Swish and swallow 17m (1 teaspoonful) every 4 hours. (Patient taking differently: Take 5 mLs by mouth 4 (four) times daily as needed for mouth pain. ) 360 mL 1  .  ammonium lactate (AMLACTIN) 12 % cream Apply topically as needed for dry skin.    Marland Kitchen aspirin 81 MG tablet Take 162 mg by mouth 2 (two) times daily.     Marland Kitchen dexamethasone (DECADRON) 4 MG tablet Take 1 tablet (4 mg total) by mouth 4 (four) times daily. 60 tablet 0  . docusate sodium (EQUATE STOOL SOFTENER) 100 MG capsule Take 100 mg by mouth 2 (two) times daily as needed for mild constipation.     Marland Kitchen HYDROcodone-acetaminophen (NORCO) 10-325 MG tablet Take 1 tablet by mouth  every 6 (six) hours as needed for moderate pain. 120 tablet 0  . magnesium hydroxide (MILK OF MAGNESIA) 800 MG/5ML suspension Take 15 mLs by mouth 2 (two) times daily as needed for constipation.    . metFORMIN (GLUCOPHAGE) 500 MG tablet Take 1 tablet (500 mg total) by mouth 2 (two) times daily with a meal. 60 tablet 1  . morphine (MS CONTIN) 60 MG 12 hr tablet Take 1 tablet (60 mg total) by mouth every 12 (twelve) hours. 60 tablet 0  . ondansetron (ZOFRAN) 8 MG tablet Take 8 mg by mouth every 8 (eight) hours as needed for nausea or vomiting.     . polyethylene glycol (MIRALAX / GLYCOLAX) packet Take 17 g by mouth daily as needed for mild constipation.     . potassium chloride SA (K-DUR,KLOR-CON) 20 MEQ tablet Take 2 tablets (40 mEq total) by mouth 2 (two) times daily. 60 tablet 0  . prochlorperazine (COMPAZINE) 10 MG tablet Take 10 mg by mouth every 6 (six) hours as needed for nausea or vomiting.     Marland Kitchen XALKORI 250 MG capsule Take 250 mg by mouth 2 (two) times daily.    . Nivolumab (OPDIVO IV) Inject into the vein every 14 (fourteen) days. To start 12/11/14    . nystatin (MYCOSTATIN) 100000 UNIT/ML suspension Take 5 mLs (500,000 Units total) by mouth 4 (four) times daily. Advise to take until gone 240 mL 2   No current facility-administered medications for this visit.    Review of Systems  Constitutional: Positive for malaise/fatigue. Negative for fever, chills and weight loss.   HENT: Negative for congestion, ear discharge, ear pain, hearing loss, nosebleeds, sore throat and tinnitus.  Difficulty swallowing Eyes: Negative for blurred vision, double vision, photophobia, pain, discharge and redness.  Respiratory: Negative for cough and stridor.   Cardiovascular: Negative for chest pain, palpitations, orthopnea, claudication, leg swelling and PND.  Gastrointestinal: Negative for nausea, vomiting and abdominal pain.  Genitourinary: Negative for dysuria and hematuria.  Musculoskeletal: Positive for  joint pain and neck pain. Negative for myalgias. Weakened left leg Skin: Negative for rash.  Neurological: Positive for focal weakness and weakness. Negative for dizziness, tingling, tremors, sensory change, speech change, seizures, loss of consciousness and headaches. Sleeping more and increased CONFUSION Now wheelchair bound Endo/Heme/Allergies: Negative.   Psychiatric/Behavioral: Negative for depression, suicidal ideas, hallucinations, memory loss and substance abuse. The patient has insomnia.    14 point ROS was done and is otherwise as detailed above or in HPI   PHYSICAL EXAMINATION: ECOG PERFORMANCE STATUS: 3 - Symptomatic, >50% confined to bed  Filed Vitals:   01/22/15 1004  BP: 93/62  Pulse: 93  Temp: 98.2 F (36.8 C)  Resp: 18   Filed Weights   Physical Exam  Constitutional: He is oriented to person, place, and time. Confused during our conversation in a wheelchair thin  HENT:  Head: Normocephalic and atraumatic.  Mouth/Throat: Oropharynx is clear and moist.  No oropharyngeal exudate.  Eyes: Conjunctivae are normal. Pupils are equal, round, and reactive to light. Right eye exhibits no discharge. Left eye exhibits no discharge. No scleral icterus.  Neck: Normal range of motion. Neck supple.  Cardiovascular: Normal rate and regular rhythm.   Pulmonary/Chest: occasional coarse rhonchi that clear with a cough Abdominal: Soft. Bowel sounds are normal. He exhibits no distension and no mass. There is no tenderness. There is no rebound and no guarding.  Musculoskeletal: Normal range of motion.  Lymphadenopathy:    He has no cervical adenopathy.  Neurological: Confused but pleasant.  Skin: Skin is warm and dry. He is not diaphoretic. No erythema.  Psychiatric: Mood pleasant memory, affect and judgment somewhat impaired.    LABORATORY DATA:  I have reviewed the data as listed Lab Results  Component Value Date   WBC 16.9* 01/16/2015   HGB 8.3* 01/16/2015   HCT 28.3*  01/16/2015   MCV 81.1 01/16/2015   PLT 693* 01/16/2015   CMP     Component Value Date/Time   NA 140 01/16/2015 1117   K 5.6* 01/16/2015 1117   CL 105 01/16/2015 1117   CO2 28 01/16/2015 1117   GLUCOSE 158* 01/16/2015 1117   BUN 23* 01/16/2015 1117   CREATININE 0.77 01/16/2015 1117   CALCIUM 8.7* 01/16/2015 1117   PROT 7.5 01/16/2015 1117   ALBUMIN 1.5* 01/16/2015 1117   AST 26 01/16/2015 1117   ALT 17 01/16/2015 1117   ALKPHOS 90 01/16/2015 1117   BILITOT 0.5 01/16/2015 1117   GFRNONAA >60 01/16/2015 1117   GFRAA >60 01/16/2015 1117   RADIOLOGY: CLINICAL DATA: Lung cancer. Abnormal brainstem appearance on CT.  EXAM: MRI HEAD WITHOUT AND WITH CONTRAST  TECHNIQUE: Multiplanar, multiecho pulse sequences of the brain and surrounding structures were obtained without and with intravenous contrast.  CONTRAST: 35m MULTIHANCE GADOBENATE DIMEGLUMINE 529 MG/ML IV SOLN  COMPARISON: CT head 01/14/2015. Also CT head without contrast 06/30/2014.  FINDINGS: No acute stroke, hydrocephalus, or extra-axial fluid. Premature for age atrophy. Chronic microvascular ischemic change.  Within the pons, there is an expansile lesion with surrounding vasogenic edema, peripheral restricted diffusion, central necrosis, and postcontrast rim enhancement. The lesions spreads into the RIGHT cerebellum, and expands outward into the RIGHT cerebellopontine angle and pre pontine cistern. The lesion is septated, but there is no significant intratumoral hemorrhage. Basilar artery is displaced RIGHT to LEFT. The lesion measures 35 x 30 x 30 mm (R-L x A-P x C-C). The lesion appears solitary. There is effacement of the aqueduct of Sylvius, and the patient is at risk for hydrocephalus.  Normal pituitary and cerebellar tonsils. Extracranial soft tissues are unremarkable. No vascular occlusion. Compared with CT from 11/02, the appearance is similar.  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 Primary brainstem glioma is less favored.  There is effacement of the aqueduct of Sylvius, and the patient is at risk for hydrocephalus.  Mild atrophy with moderately extensive small vessel disease.  Electronically Signed: By: JStaci RighterM.D. On: 01/16/2015 11:06   CLINICAL DATA: Lung cancer. Dizziness and occasional headaches for 1 month.  EXAM: CT HEAD WITHOUT AND WITH CONTRAST  TECHNIQUE: Contiguous axial images were obtained from the base of the skull through the vertex without and with intravenous contrast  CONTRAST: 787mOMNIPAQUE IOHEXOL 300 MG/ML SOLN  COMPARISON: CT head without contrast 06/30/2014  FINDINGS: A heterogeneous enhancing mass lesion is  centered within the pons lesion measures 2.9 x 2.9 x over 2.4 cm. There is significant surrounding vasogenic edema extending into the right cerebellar peduncle and through the right cerebral peduncle into the inferior right thalamus. The basal ganglia are otherwise intact. No other focal enhancing lesions are evident. The ventricles are of normal size. No significant extra-axial fluid collection is present.  The paranasal sinuses and mastoid air cells are clear.  IMPRESSION: 1. 2.9 x 2.9 by at least 2.4 cm heterogeneous enhancing mass lesions centered within the pons compatible with a focal metastasis. There is significant surrounding vasogenic edema. 2. No other definite focal enhancement. 3. Recommend MRI of the brain without and with contrast for further evaluation of this lesion and other possible metastases. These results will be called to the ordering clinician or representative by the Radiologist Assistant, and communication documented in the PACS or zVision Dashboard.   Electronically Signed  By: San Morelle M.D.  On: 01/14/2015 14:54  ASSESSMENT & PLAN:  CUP,  most consistent with LUNG primary Biopsy on 08/04/2014 c/w poorly differentiated carcinoma Recent CVA 06/30/14 Admitted at [facility]. He presented with left sided weakness and slurred speech Anemia Pleurx catheter placement in right lung, malignant plural effusion MET amplification XALKORI Palliative XRT with good pain control DNR status Brain Metastases Worsening confusion, Declining PS  We spent time in discussion of hospice.  I think at times he understood our conversation and I explained to him that he can come and see [place] if necessary. We discussed that our staff and myself will be actively involved in his care at home working through the hospice staff. I again reviewed the necessity of hospice not just to help him but his wife and family with his care and with their emotional needs.  He is agreeable at this time to another hospice consultation.   Prognosis is poor. I tried to prepare his family for a very short life expectancy, I anticipate only a few weeks at the longest.   Emotional support was provided to the family.  All questions were answered. The patient knows to call the clinic with any problems, questions or concerns.   This note was electronically signed.   Kelby Fam. Kataleia Quaranta MD

## 2015-01-22 ENCOUNTER — Inpatient Hospital Stay (HOSPITAL_COMMUNITY): Payer: Medicare Other

## 2015-01-22 ENCOUNTER — Encounter (HOSPITAL_BASED_OUTPATIENT_CLINIC_OR_DEPARTMENT_OTHER): Payer: Medicare Other | Admitting: Hematology & Oncology

## 2015-01-22 ENCOUNTER — Encounter (HOSPITAL_COMMUNITY): Payer: Self-pay | Admitting: Hematology & Oncology

## 2015-01-22 VITALS — BP 93/62 | HR 93 | Temp 98.2°F | Resp 18

## 2015-01-22 DIAGNOSIS — C7931 Secondary malignant neoplasm of brain: Secondary | ICD-10-CM | POA: Diagnosis not present

## 2015-01-22 DIAGNOSIS — I639 Cerebral infarction, unspecified: Secondary | ICD-10-CM

## 2015-01-22 DIAGNOSIS — J910 Malignant pleural effusion: Secondary | ICD-10-CM | POA: Diagnosis not present

## 2015-01-22 DIAGNOSIS — C3411 Malignant neoplasm of upper lobe, right bronchus or lung: Secondary | ICD-10-CM

## 2015-01-22 DIAGNOSIS — D649 Anemia, unspecified: Secondary | ICD-10-CM | POA: Diagnosis not present

## 2015-01-22 DIAGNOSIS — Z66 Do not resuscitate: Secondary | ICD-10-CM

## 2015-01-22 DIAGNOSIS — R4182 Altered mental status, unspecified: Secondary | ICD-10-CM

## 2015-01-22 DIAGNOSIS — R634 Abnormal weight loss: Secondary | ICD-10-CM

## 2015-01-22 DIAGNOSIS — R410 Disorientation, unspecified: Secondary | ICD-10-CM

## 2015-01-22 DIAGNOSIS — Z7189 Other specified counseling: Secondary | ICD-10-CM

## 2015-01-22 MED ORDER — NYSTATIN 100000 UNIT/ML MT SUSP: 5.0000 mL | Freq: Four times a day (QID) | OROMUCOSAL | Status: AC

## 2015-01-22 NOTE — Patient Instructions (Signed)
[place] at [facility] Discharge Instructions  RECOMMENDATIONS MADE BY THE CONSULTANT AND ANY TEST RESULTS WILL BE SENT TO YOUR REFERRING PHYSICIAN.  Exam and discussion by Dr. Whitney Muse. Will call in a prescription for some medication for the thrush you have in your mouth. Dr. Whitney Muse will talk with our Palliative Care Nurse Practitioner and will get back with you regarding hospice. Stop the Xalkori. Call with any concerns or issues.  No follow-up scheduled at this time.  Thank you for choosing [place] at [facility] to provide your oncology and hematology care.  To afford each patient quality time with our provider, please arrive at least 15 minutes before your scheduled appointment time.    You need to re-schedule your appointment should you arrive 10 or more minutes late.  We strive to give you quality time with our providers, and arriving late affects you and other patients whose appointments are after yours.  Also, if you no show three or more times for appointments you may be dismissed from the clinic at the providers discretion.     Again, thank you for choosing [facility].  Our hope is that these requests will decrease the amount of time that you wait before being seen by our physicians.       _____________________________________________________________  Should you have questions after your visit to [facility], please contact our office at [phone] between the hours of 8:30 a.m. and 4:30 p.m.  Voicemails left after 4:30 p.m. will not be returned until the following business day.  For prescription refill requests, have your pharmacy contact our office.

## 2015-01-23 ENCOUNTER — Encounter (HOSPITAL_COMMUNITY): Payer: Self-pay | Admitting: Lab

## 2015-01-23 NOTE — Progress Notes (Signed)
Referral sent to [facility].  Talked with Joni and she will contact patient.  Records faxed on 11/11

## 2015-02-04 ENCOUNTER — Inpatient Hospital Stay (HOSPITAL_COMMUNITY): Payer: Medicare Other

## 2015-02-12 ENCOUNTER — Inpatient Hospital Stay (HOSPITAL_COMMUNITY): Payer: Medicare Other

## 2015-02-12 ENCOUNTER — Ambulatory Visit (HOSPITAL_COMMUNITY): Payer: Medicare Other | Admitting: Oncology

## 2015-02-12 DEATH — deceased

## 2016-03-26 IMAGING — NM NM BONE WHOLE BODY
4 series · 4 of 4 positions shown · non-contrast
Comparison: None.

CLINICAL DATA: Recent diagnosis of right upper lobe lung cancer
(Pancoast tumor). Chronic low back pain for approximately 7 years.
Staging.

EXAM:
NUCLEAR MEDICINE WHOLE BODY BONE SCAN
TECHNIQUE: Whole body anterior and posterior images were obtained approximately
3 hours after intravenous injection of radiopharmaceutical.
RADIOPHARMACEUTICALS:  24.6 mCi 4echnetium-UUm MDP IV

[Series 1: whole body · 2.66mm/px · 1 of 1 slices shown (1 of 2)]
[im 1/1]
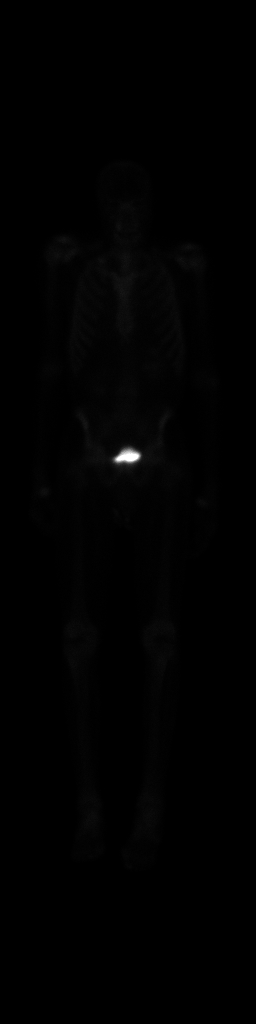

[Series 1: whole body · 2.66mm/px · 1 of 1 slices shown (2 of 2)]
[im 1/1]
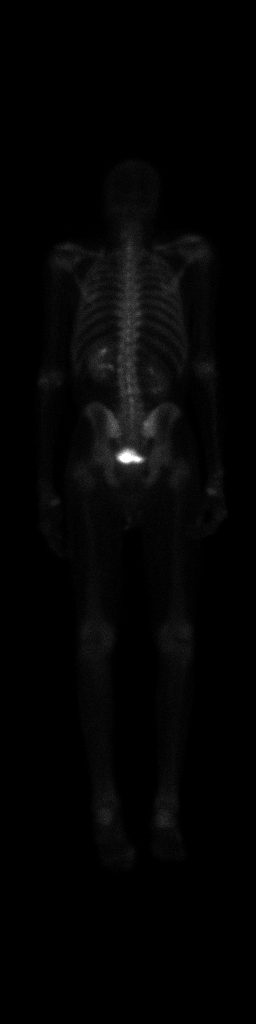

[Series 1: wbr_bone_60 whole body · 2.66mm/px · 1 of 1 slices shown (1 of 2)]
[im 1/1]
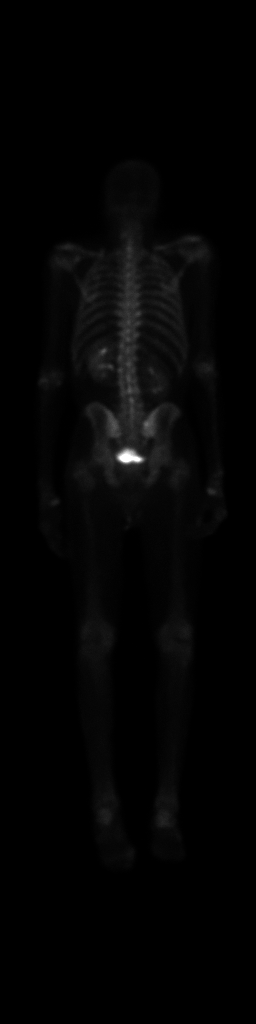

[Series 1: wbr_bone_60 whole body · 2.66mm/px · 1 of 1 slices shown (2 of 2)]
[im 1/1]
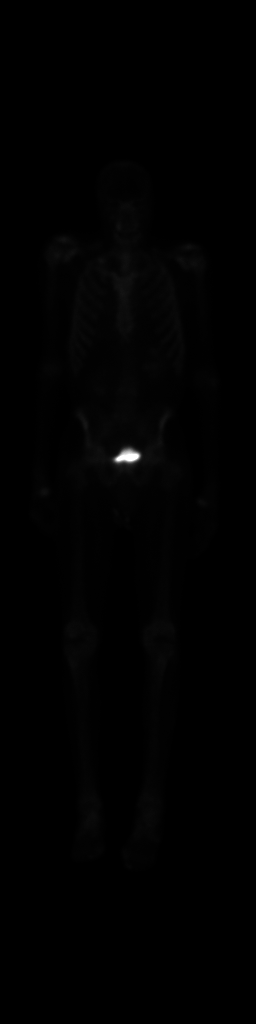

[4 of 4 positions shown; findings below may reference images not displayed]

FINDINGS: Increased activity involving the right anterior 1st rib. Increased
activity involving the right maxilla. No abnormal activity to
suggest metastatic disease elsewhere. Symmetric activity involving
the 1st MTP joints bilaterally, the acromioclavicular joints
bilaterally, and the forefeet and mid feet bilaterally. Urinary
tract activity related to physiologic excretion.
IMPRESSION: 1. Activity involving the right anterior 1st rib, likely secondary
to tumor involvement.
2. No evidence of metastatic disease elsewhere.
3. Activity in the right side of the maxilla, more likely related to
dental disease than metastatic disease.
4. Degenerative activity involving the 1st MTP joints, the
acromioclavicular joints, the forefeet and the mid feet bilaterally.
